# Patient Record
Sex: Female | Born: 1985 | Race: White | Hispanic: No | Marital: Single | Smoking: Never smoker
Health system: Southern US, Community
[De-identification: ages and names within clinical notes are randomized; demographics above are authoritative.]

## PROBLEM LIST (undated history)

## (undated) DIAGNOSIS — R519 Headache, unspecified: Secondary | ICD-10-CM

## (undated) DIAGNOSIS — K5190 Ulcerative colitis, unspecified, without complications: Secondary | ICD-10-CM

## (undated) DIAGNOSIS — F329 Major depressive disorder, single episode, unspecified: Secondary | ICD-10-CM

## (undated) DIAGNOSIS — O139 Gestational [pregnancy-induced] hypertension without significant proteinuria, unspecified trimester: Secondary | ICD-10-CM

## (undated) DIAGNOSIS — R51 Headache: Secondary | ICD-10-CM

## (undated) DIAGNOSIS — F32A Depression, unspecified: Secondary | ICD-10-CM

## (undated) DIAGNOSIS — J45909 Unspecified asthma, uncomplicated: Secondary | ICD-10-CM

## (undated) DIAGNOSIS — M5020 Other cervical disc displacement, unspecified cervical region: Secondary | ICD-10-CM

## (undated) HISTORY — PX: WISDOM TOOTH EXTRACTION: SHX21

## (undated) HISTORY — DX: Depression, unspecified: F32.A

## (undated) HISTORY — PX: COLONOSCOPY: SHX174

## (undated) HISTORY — DX: Other cervical disc displacement, unspecified cervical region: M50.20

## (undated) HISTORY — PX: KNEE SURGERY: SHX244

## (undated) HISTORY — DX: Headache, unspecified: R51.9

## (undated) HISTORY — DX: Major depressive disorder, single episode, unspecified: F32.9

## (undated) HISTORY — DX: Headache: R51

## (undated) HISTORY — PX: CHOLECYSTECTOMY: SHX55

## (undated) HISTORY — DX: Gestational (pregnancy-induced) hypertension without significant proteinuria, unspecified trimester: O13.9

---

## 2013-11-30 DIAGNOSIS — O139 Gestational [pregnancy-induced] hypertension without significant proteinuria, unspecified trimester: Secondary | ICD-10-CM

## 2013-11-30 HISTORY — DX: Gestational (pregnancy-induced) hypertension without significant proteinuria, unspecified trimester: O13.9

## 2014-10-04 DIAGNOSIS — O1490 Unspecified pre-eclampsia, unspecified trimester: Secondary | ICD-10-CM

## 2018-11-30 NOTE — L&D Delivery Note (Signed)
OB/GYN Faculty Practice Delivery Note  Vanessa Nolan is a 33 y.o. [id number] s/p SVD at [redacted]w[redacted]d. She was admitted for post-dates induction of labor.   ROM: 3h 66m with meconium-stained fluid fluid GBS Status: negative Maximum Maternal Temperature: Temp (24hrs), Avg:98.2 F (36.8 C), Min:97.6 F (36.4 C), Max:98.5 F (36.9 C)  Labor Progress: . Induction started with cytotec . Transitioned to pitocin . AROM meconium-stained fluid . Progressed to complete  Delivery Date/Time: 04/16/19 at 0058 Delivery: Called to room and patient was complete and pushing. Head delivered direct OP. Loose nuchal cord present reduced after delivery. Shoulder and body delivered in usual fashion. Infant with spontaneous cry, placed on mother's abdomen, dried and stimulated. Cord clamped x 2 after 1-minute delay, and cut by father of baby. Cord blood drawn. Placenta delivered spontaneously with gentle cord traction. Fundus firm with massage and Pitocin. Labia, perineum, vagina, and cervix inspected inspected with 1st degree perineal laceration and superficial labial lacerations. Of note, patient complaining of significant back pain throughout labor course, noted to be a chronic problem. Given IV fentanyl and oxycodone immediately after delivery for back pain. Discussed with patient that OP presentation likely exacerbated chronic back pain.   Placenta: spontaneous, intact, 3-vessel cord (sent to pathology given fetal diaphragmatic hernia) Complications: NICU attendance but infant vigorous, routine resuscitation Lacerations: 1st degree perineal and labial hemostatic EBL: 200cc Analgesia: IV fentanyl   Postpartum Planning [x]  message to sent to schedule follow-up  [x]  vaccines UTD  Infant: Vigorous female  APGARs 9, 9  weight pending but appears AGA   [name] S. Earlene Plater, DO OB/GYN Fellow, Faculty Practice

## 2018-12-01 ENCOUNTER — Emergency Department (HOSPITAL_BASED_OUTPATIENT_CLINIC_OR_DEPARTMENT_OTHER): Payer: Medicaid Other

## 2018-12-01 ENCOUNTER — Emergency Department (HOSPITAL_BASED_OUTPATIENT_CLINIC_OR_DEPARTMENT_OTHER)
Admission: EM | Admit: 2018-12-01 | Discharge: 2018-12-02 | Disposition: A | Discharge status: Home / Self Care | Payer: Medicaid Other | Attending: Emergency Medicine | Admitting: Emergency Medicine

## 2018-12-01 ENCOUNTER — Encounter (HOSPITAL_BASED_OUTPATIENT_CLINIC_OR_DEPARTMENT_OTHER): Payer: Self-pay

## 2018-12-01 ENCOUNTER — Other Ambulatory Visit: Payer: Self-pay

## 2018-12-01 DIAGNOSIS — O99512 Diseases of the respiratory system complicating pregnancy, second trimester: Secondary | ICD-10-CM | POA: Diagnosis present

## 2018-12-01 DIAGNOSIS — J111 Influenza due to unidentified influenza virus with other respiratory manifestations: Secondary | ICD-10-CM | POA: Diagnosis not present

## 2018-12-01 DIAGNOSIS — R6889 Other general symptoms and signs: Secondary | ICD-10-CM

## 2018-12-01 HISTORY — DX: Ulcerative colitis, unspecified, without complications: K51.90

## 2018-12-01 LAB — URINALYSIS, ROUTINE W REFLEX MICROSCOPIC
Bilirubin Urine: NEGATIVE
Glucose, UA: NEGATIVE mg/dL
Hgb urine dipstick: NEGATIVE
Ketones, ur: 15 mg/dL — AB
Leukocytes, UA: NEGATIVE
Nitrite: NEGATIVE
Protein, ur: NEGATIVE mg/dL
Specific Gravity, Urine: >1.03 — ABNORMAL HIGH (ref 1.005–1.030)
pH: 5.5 (ref 5.0–8.0)

## 2018-12-01 MED ORDER — ACETAMINOPHEN 325 MG PO TABS
650.0000 mg | ORAL_TABLET | Freq: Once | ORAL | Status: AC
  Administered 2018-12-02: 650 mg via ORAL
  Filled 2018-12-01: qty 2

## 2018-12-01 MED ORDER — LACTATED RINGERS IV BOLUS
1000.0000 mL | Freq: Once | INTRAVENOUS | Status: AC
  Administered 2018-12-01: 1000 mL via INTRAVENOUS

## 2018-12-01 NOTE — ED Notes (Addendum)
Pt c/o cough with chest and sinus congestion and fatigue x 2 months. Pt states she has been lying in bed x 2 days and felt like she needed to be evaluated. Pt also acknowledges pregnancy with no prental care r/t moving and not wanting to change physicians.

## 2018-12-01 NOTE — ED Notes (Signed)
Urine specimen to lab

## 2018-12-01 NOTE — ED Triage Notes (Signed)
Pt c/o flu like sx and fatigue x 2 months-pt states she is 4 months pregnant from LMP-no prenatal-pt NAD-steady gait

## 2018-12-02 LAB — CBC WITH DIFFERENTIAL/PLATELET
Abs Immature Granulocytes: 0.05 10*3/uL (ref 0.00–0.07)
Basophils Absolute: 0 10*3/uL (ref 0.0–0.1)
Basophils Relative: 0 %
Eosinophils Absolute: 0.1 10*3/uL (ref 0.0–0.5)
Eosinophils Relative: 1 %
HCT: 37.3 % (ref 36.0–46.0)
Hemoglobin: 12.3 g/dL (ref 12.0–15.0)
Immature Granulocytes: 1 %
Lymphocytes Relative: 17 %
Lymphs Abs: 0.8 10*3/uL (ref 0.7–4.0)
MCH: 29.4 pg (ref 26.0–34.0)
MCHC: 33 g/dL (ref 30.0–36.0)
MCV: 89 fL (ref 80.0–100.0)
Monocytes Absolute: 0.6 10*3/uL (ref 0.1–1.0)
Monocytes Relative: 13 %
Neutro Abs: 3.4 10*3/uL (ref 1.7–7.7)
Neutrophils Relative %: 68 %
Platelets: 218 10*3/uL (ref 150–400)
RBC: 4.19 MIL/uL (ref 3.87–5.11)
RDW: 13.5 % (ref 11.5–15.5)
WBC: 5 10*3/uL (ref 4.0–10.5)
nRBC: 0 % (ref 0.0–0.2)

## 2018-12-02 LAB — BASIC METABOLIC PANEL
Anion gap: 10 (ref 5–15)
BUN: 7 mg/dL (ref 6–20)
CO2: 19 mmol/L — ABNORMAL LOW (ref 22–32)
Calcium: 8.8 mg/dL — ABNORMAL LOW (ref 8.9–10.3)
Chloride: 106 mmol/L (ref 98–111)
Creatinine, Ser: 0.73 mg/dL (ref 0.44–1.00)
GFR calc Af Amer: >60 mL/min (ref >60)
GFR calc non Af Amer: >60 mL/min (ref >60)
Glucose, Bld: 104 mg/dL — ABNORMAL HIGH (ref 70–99)
Potassium: 3.5 mmol/L (ref 3.5–5.1)
Sodium: 135 mmol/L (ref 135–145)

## 2018-12-02 NOTE — ED Provider Notes (Signed)
[facility] EMERGENCY DEPARTMENT Provider Note   CSN: [id number] Arrival date & time: 12/01/18  2214     History   Chief Complaint Chief Complaint  Patient presents with  . Cough    HPI Vanessa Nolan is a 33 y.o. female.  HPI   33 year old female with multiple complaints.  She reports feeling "sick" for the past month.  Worsening symptoms in the past 2 days.  This is included body aches, subjective fevers, no energy, sore throat.  No urinary complaints.  She does not feel short of breath.  She does not take anything for her symptoms.  She is approximately 4 months pregnant without any prenatal care.  Past Medical History:  Diagnosis Date  . Ulcerative colitis ([place])     There are no active problems to display for this patient.   Past Surgical History:  Procedure Laterality Date  . CHOLECYSTECTOMY    . KNEE SURGERY       OB History    Gravida  7   Para  4   Term      Preterm      AB  1   Living        SAB  1   TAB      Ectopic      Multiple      Live Births               Home Medications    Prior to Admission medications   Medication Sig Start Date End Date Taking? Authorizing Provider  acetaminophen-codeine (TYLENOL #4) 300-60 MG tablet Take 1 tablet by mouth every 4 (four) hours as needed for pain.   Yes [provider]    Family History No family history on file.  Social History Social History   Tobacco Use  . Smoking status: Never Smoker  . Smokeless tobacco: Never Used  Substance Use Topics  . Alcohol use: Not Currently  . Drug use: Never     Allergies   Morphine and related   Review of Systems Review of Systems  All systems reviewed and negative, other than as noted in HPI.  Physical Exam Updated Vital Signs BP 126/71 (BP Location: Right Arm)   Pulse 76   Temp 99.4 F (37.4 C) (Oral)   Resp 18   Ht 5' 6.5" (1.689 m)   Wt 128.6 kg   LMP 07/16/2018 Comment: 4 months pregnant, lead shielding  provided//a.c.  SpO2 97%   BMI 45.09 kg/m   Physical Exam Vitals signs and nursing note reviewed.  Constitutional:      General: She is not in acute distress.    Appearance: She is well-developed. She is obese. She is not ill-appearing, toxic-appearing or diaphoretic.  HENT:     Head: Normocephalic and atraumatic.  Eyes:     General:        Right eye: No discharge.        Left eye: No discharge.     Conjunctiva/sclera: Conjunctivae normal.  Neck:     Musculoskeletal: Neck supple.  Cardiovascular:     Rate and Rhythm: Regular rhythm. Tachycardia present.     Heart sounds: Normal heart sounds. No murmur. No friction rub. No gallop.   Pulmonary:     Effort: Pulmonary effort is normal. No respiratory distress.     Breath sounds: Normal breath sounds.  Abdominal:     General: There is no distension.     Palpations: Abdomen is soft.  Tenderness: There is no abdominal tenderness.  Musculoskeletal:        General: No tenderness.  Skin:    General: Skin is warm and dry.  Neurological:     Mental Status: She is alert.  Psychiatric:        Behavior: Behavior normal.        Thought Content: Thought content normal.      ED Treatments / Results  Labs (all labs ordered are listed, but only abnormal results are displayed) Labs Reviewed  URINALYSIS, ROUTINE W REFLEX MICROSCOPIC - Abnormal; Notable for the following components:      Result Value   APPearance CLOUDY (*)    Specific Gravity, Urine >1.030 (*)    Ketones, ur 15 (*)    All other components within normal limits  BASIC METABOLIC PANEL - Abnormal; Notable for the following components:   CO2 19 (*)    Glucose, Bld 104 (*)    Calcium 8.8 (*)    All other components within normal limits  CBC WITH DIFFERENTIAL/PLATELET    EKG None  Radiology Dg Chest 2 View  Result Date: 12/02/2018 CLINICAL DATA:  Acute onset of cough, sinus congestion and fatigue. Fever. EXAM: CHEST - 2 VIEW COMPARISON:  None. FINDINGS: The  lungs are well-aerated. Minimal bibasilar atelectasis is noted. There is no evidence of pleural effusion or pneumothorax. The heart is normal in size; the mediastinal contour is within normal limits. No acute osseous abnormalities are seen. IMPRESSION: Minimal bibasilar atelectasis noted; lungs otherwise clear. Electronically Signed   By: Garald Balding M.D.   On: 12/02/2018 00:46    Procedures Procedures (including critical care time)  Medications Ordered in ED Medications  lactated ringers bolus 1,000 mL ( Intravenous Stopped 12/02/18 0105)  acetaminophen (TYLENOL) tablet 650 mg (650 mg Oral Given 12/02/18 0011)     Initial Impression / Assessment and Plan / ED Course  I have reviewed the triage vital signs and the nursing notes.  Pertinent labs & imaging results that were available during my care of the patient were reviewed by me and considered in my medical decision making (see chart for details).    33 year old female flulike symptoms.  Tachycardia resolved after some IV fluids and Tylenol.  ED work-up fairly unremarkable.  Chest x-ray clear.  Plan continue symptomatic treatment.  Encouraged to establish OB care. Final Clinical Impressions(s) / ED Diagnoses   Final diagnoses:  Flu-like symptoms    ED Discharge Orders    None       Virgel Manifold, MD 12/02/18 [id number]

## 2019-01-20 ENCOUNTER — Ambulatory Visit (INDEPENDENT_AMBULATORY_CARE_PROVIDER_SITE_OTHER): Payer: Medicaid Other | Admitting: *Deleted

## 2019-01-20 ENCOUNTER — Other Ambulatory Visit: Payer: Self-pay

## 2019-01-20 ENCOUNTER — Other Ambulatory Visit (HOSPITAL_COMMUNITY)
Admission: RE | Admit: 2019-01-20 | Discharge: 2019-01-20 | Disposition: A | Discharge status: Home / Self Care | Payer: Medicaid Other | Source: Ambulatory Visit | Attending: Family Medicine | Admitting: Family Medicine

## 2019-01-20 ENCOUNTER — Encounter: Payer: Self-pay | Admitting: *Deleted

## 2019-01-20 VITALS — BP 137/74 | HR 89 | Wt 286.2 lb

## 2019-01-20 DIAGNOSIS — K5190 Ulcerative colitis, unspecified, without complications: Secondary | ICD-10-CM | POA: Insufficient documentation

## 2019-01-20 DIAGNOSIS — O09299 Supervision of pregnancy with other poor reproductive or obstetric history, unspecified trimester: Secondary | ICD-10-CM | POA: Insufficient documentation

## 2019-01-20 DIAGNOSIS — M549 Dorsalgia, unspecified: Secondary | ICD-10-CM

## 2019-01-20 DIAGNOSIS — O0990 Supervision of high risk pregnancy, unspecified, unspecified trimester: Secondary | ICD-10-CM | POA: Insufficient documentation

## 2019-01-20 DIAGNOSIS — G8929 Other chronic pain: Secondary | ICD-10-CM | POA: Insufficient documentation

## 2019-01-20 DIAGNOSIS — Z8669 Personal history of other diseases of the nervous system and sense organs: Secondary | ICD-10-CM | POA: Insufficient documentation

## 2019-01-20 DIAGNOSIS — Z8659 Personal history of other mental and behavioral disorders: Secondary | ICD-10-CM

## 2019-01-20 NOTE — Progress Notes (Signed)
New Ob intake completed and pregnancy information packet given. Labs drawn, Medicaid Home form completed and [place] for anatomy scheduled. Initial Ob visit w/provider scheduled on 3/9.

## 2019-01-21 LAB — PROTEIN / CREATININE RATIO, URINE
Creatinine, Urine: 190.6 mg/dL
PROTEIN/CREAT RATIO: 150 mg/g{creat} (ref 0–200)
Protein, Ur: 28.5 mg/dL

## 2019-01-22 LAB — CULTURE, OB URINE

## 2019-01-22 LAB — URINE CULTURE, OB REFLEX: Organism ID, Bacteria: NO GROWTH

## 2019-01-23 LAB — GC/CHLAMYDIA PROBE AMP (~~LOC~~) NOT AT ARMC
Chlamydia: NEGATIVE
NEISSERIA GONORRHEA: NEGATIVE

## 2019-01-25 ENCOUNTER — Ambulatory Visit (HOSPITAL_COMMUNITY): Payer: Medicaid Other | Admitting: *Deleted

## 2019-01-25 ENCOUNTER — Ambulatory Visit (HOSPITAL_COMMUNITY)
Admission: RE | Admit: 2019-01-25 | Discharge: 2019-01-25 | Disposition: A | Discharge status: Home / Self Care | Payer: Medicaid Other | Source: Ambulatory Visit | Attending: Obstetrics and Gynecology | Admitting: Obstetrics and Gynecology

## 2019-01-25 DIAGNOSIS — Z3A29 29 weeks gestation of pregnancy: Secondary | ICD-10-CM

## 2019-01-25 DIAGNOSIS — O09299 Supervision of pregnancy with other poor reproductive or obstetric history, unspecified trimester: Secondary | ICD-10-CM

## 2019-01-25 DIAGNOSIS — O0990 Supervision of high risk pregnancy, unspecified, unspecified trimester: Secondary | ICD-10-CM | POA: Diagnosis present

## 2019-01-25 DIAGNOSIS — Z363 Encounter for antenatal screening for malformations: Secondary | ICD-10-CM | POA: Diagnosis not present

## 2019-01-25 DIAGNOSIS — O09213 Supervision of pregnancy with history of pre-term labor, third trimester: Secondary | ICD-10-CM | POA: Diagnosis not present

## 2019-01-25 DIAGNOSIS — O2693 Pregnancy related conditions, unspecified, third trimester: Secondary | ICD-10-CM

## 2019-01-25 DIAGNOSIS — O283 Abnormal ultrasonic finding on antenatal screening of mother: Secondary | ICD-10-CM | POA: Insufficient documentation

## 2019-01-25 DIAGNOSIS — O09293 Supervision of pregnancy with other poor reproductive or obstetric history, third trimester: Secondary | ICD-10-CM | POA: Diagnosis not present

## 2019-01-26 ENCOUNTER — Other Ambulatory Visit (HOSPITAL_COMMUNITY): Payer: Self-pay | Admitting: *Deleted

## 2019-01-26 DIAGNOSIS — Q332 Sequestration of lung: Secondary | ICD-10-CM

## 2019-01-31 ENCOUNTER — Telehealth: Payer: Self-pay | Admitting: Family Medicine

## 2019-01-31 LAB — OBSTETRIC PANEL, INCLUDING HIV
Antibody Screen: NEGATIVE
Basophils Absolute: 0 10*3/uL (ref 0.0–0.2)
Basos: 0 %
EOS (ABSOLUTE): 0.1 10*3/uL (ref 0.0–0.4)
Eos: 1 %
HIV Screen 4th Generation wRfx: NONREACTIVE
Hematocrit: 36.1 % (ref 34.0–46.6)
Hemoglobin: 11.4 g/dL (ref 11.1–15.9)
Hepatitis B Surface Ag: NEGATIVE
Immature Grans (Abs): 0.1 10*3/uL (ref 0.0–0.1)
Immature Granulocytes: 1 %
Lymphocytes Absolute: 1.4 10*3/uL (ref 0.7–3.1)
Lymphs: 13 %
MCH: 27.5 pg (ref 26.6–33.0)
MCHC: 31.6 g/dL (ref 31.5–35.7)
MCV: 87 fL (ref 79–97)
Monocytes Absolute: 0.6 10*3/uL (ref 0.1–0.9)
Monocytes: 5 %
Neutrophils Absolute: 8.3 10*3/uL — ABNORMAL HIGH (ref 1.4–7.0)
Neutrophils: 80 %
PLATELETS: 285 10*3/uL (ref 150–450)
RBC: 4.14 x10E6/uL (ref 3.77–5.28)
RDW: 13.7 % (ref 11.7–15.4)
RPR Ser Ql: NONREACTIVE
RUBELLA: 1.47 {index} (ref >0.99)
Rh Factor: POSITIVE
WBC: 10.6 10*3/uL (ref 3.4–10.8)

## 2019-01-31 LAB — INHERITEST(R) CF/SMA PANEL

## 2019-01-31 LAB — HEMOGLOBINOPATHY EVALUATION
Ferritin: 11 ng/mL — ABNORMAL LOW (ref 15–150)
Hgb A2 Quant: 2.5 % (ref 1.8–3.2)
Hgb A: 97.5 % (ref 96.4–98.8)
Hgb C: 0 %
Hgb F Quant: 0 % (ref 0.0–2.0)
Hgb S: 0 %
Hgb Solubility: NEGATIVE
Hgb Variant: 0 %

## 2019-01-31 LAB — COMPREHENSIVE METABOLIC PANEL
ALT: 11 IU/L (ref 0–32)
AST: 10 IU/L (ref 0–40)
Albumin/Globulin Ratio: 1.2 (ref 1.2–2.2)
Albumin: 3.5 g/dL — ABNORMAL LOW (ref 3.8–4.8)
Alkaline Phosphatase: 86 IU/L (ref 39–117)
BUN/Creatinine Ratio: 14 (ref 9–23)
BUN: 9 mg/dL (ref 6–20)
Bilirubin Total: <0.2 mg/dL (ref 0.0–1.2)
CO2: 19 mmol/L — ABNORMAL LOW (ref 20–29)
Calcium: 9 mg/dL (ref 8.7–10.2)
Chloride: 103 mmol/L (ref 96–106)
Creatinine, Ser: 0.65 mg/dL (ref 0.57–1.00)
GFR calc Af Amer: 137 mL/min/{1.73_m2} (ref >59)
GFR calc non Af Amer: 119 mL/min/{1.73_m2} (ref >59)
Globulin, Total: 3 g/dL (ref 1.5–4.5)
Glucose: 113 mg/dL — ABNORMAL HIGH (ref 65–99)
Potassium: 3.8 mmol/L (ref 3.5–5.2)
Sodium: 137 mmol/L (ref 134–144)
TOTAL PROTEIN: 6.5 g/dL (ref 6.0–8.5)

## 2019-01-31 NOTE — Telephone Encounter (Signed)
Called the patient in attempt to reschedule the appointment. Informed the patient of two different times and days however the patient did not want an appointment at an later time as she stated she is 30 weeks. Stated she has not had any prenatal care. Also stated she did not have a babysitter before 2 pm. Continued with the original appointment time.

## 2019-02-02 ENCOUNTER — Other Ambulatory Visit: Payer: Self-pay | Admitting: *Deleted

## 2019-02-02 ENCOUNTER — Encounter: Payer: Medicaid Other | Admitting: Obstetrics and Gynecology

## 2019-02-02 DIAGNOSIS — O0990 Supervision of high risk pregnancy, unspecified, unspecified trimester: Secondary | ICD-10-CM

## 2019-02-03 ENCOUNTER — Other Ambulatory Visit: Payer: Self-pay | Admitting: Obstetrics and Gynecology

## 2019-02-03 DIAGNOSIS — O0990 Supervision of high risk pregnancy, unspecified, unspecified trimester: Secondary | ICD-10-CM

## 2019-02-06 ENCOUNTER — Other Ambulatory Visit (HOSPITAL_COMMUNITY)
Admission: RE | Admit: 2019-02-06 | Discharge: 2019-02-06 | Disposition: A | Discharge status: Home / Self Care | Payer: Medicaid Other | Source: Ambulatory Visit | Attending: Obstetrics and Gynecology | Admitting: Obstetrics and Gynecology

## 2019-02-06 ENCOUNTER — Encounter: Payer: Self-pay | Admitting: Gastroenterology

## 2019-02-06 ENCOUNTER — Ambulatory Visit (INDEPENDENT_AMBULATORY_CARE_PROVIDER_SITE_OTHER): Payer: Medicaid Other | Admitting: Obstetrics and Gynecology

## 2019-02-06 ENCOUNTER — Other Ambulatory Visit: Payer: Self-pay

## 2019-02-06 ENCOUNTER — Encounter: Payer: Self-pay | Admitting: Obstetrics and Gynecology

## 2019-02-06 ENCOUNTER — Encounter: Payer: Medicaid Other | Admitting: Family Medicine

## 2019-02-06 ENCOUNTER — Ambulatory Visit: Payer: Medicaid Other | Admitting: Clinical

## 2019-02-06 ENCOUNTER — Other Ambulatory Visit: Payer: Medicaid Other

## 2019-02-06 VITALS — BP 116/81 | HR 92 | Wt 284.5 lb

## 2019-02-06 DIAGNOSIS — O0990 Supervision of high risk pregnancy, unspecified, unspecified trimester: Secondary | ICD-10-CM

## 2019-02-06 DIAGNOSIS — O26892 Other specified pregnancy related conditions, second trimester: Secondary | ICD-10-CM | POA: Diagnosis not present

## 2019-02-06 DIAGNOSIS — Z8659 Personal history of other mental and behavioral disorders: Secondary | ICD-10-CM

## 2019-02-06 DIAGNOSIS — G8929 Other chronic pain: Secondary | ICD-10-CM | POA: Diagnosis not present

## 2019-02-06 DIAGNOSIS — Z3A21 21 weeks gestation of pregnancy: Secondary | ICD-10-CM

## 2019-02-06 DIAGNOSIS — M549 Dorsalgia, unspecified: Secondary | ICD-10-CM | POA: Diagnosis not present

## 2019-02-06 DIAGNOSIS — K5190 Ulcerative colitis, unspecified, without complications: Secondary | ICD-10-CM

## 2019-02-06 DIAGNOSIS — Z79891 Long term (current) use of opiate analgesic: Secondary | ICD-10-CM | POA: Insufficient documentation

## 2019-02-06 DIAGNOSIS — Z8751 Personal history of pre-term labor: Secondary | ICD-10-CM | POA: Insufficient documentation

## 2019-02-06 DIAGNOSIS — O0932 Supervision of pregnancy with insufficient antenatal care, second trimester: Secondary | ICD-10-CM

## 2019-02-06 DIAGNOSIS — Z6841 Body Mass Index (BMI) 40.0 and over, adult: Secondary | ICD-10-CM

## 2019-02-06 DIAGNOSIS — Z23 Encounter for immunization: Secondary | ICD-10-CM

## 2019-02-06 DIAGNOSIS — O09299 Supervision of pregnancy with other poor reproductive or obstetric history, unspecified trimester: Secondary | ICD-10-CM

## 2019-02-06 DIAGNOSIS — O0930 Supervision of pregnancy with insufficient antenatal care, unspecified trimester: Secondary | ICD-10-CM | POA: Insufficient documentation

## 2019-02-06 DIAGNOSIS — O99212 Obesity complicating pregnancy, second trimester: Secondary | ICD-10-CM

## 2019-02-06 DIAGNOSIS — O99210 Obesity complicating pregnancy, unspecified trimester: Secondary | ICD-10-CM | POA: Insufficient documentation

## 2019-02-06 DIAGNOSIS — O09292 Supervision of pregnancy with other poor reproductive or obstetric history, second trimester: Secondary | ICD-10-CM

## 2019-02-06 MED ORDER — MESALAMINE 1.2 G PO TBEC: 1.2000 g | DELAYED_RELEASE_TABLET | Freq: Two times a day (BID) | ORAL | 0 refills | Status: DC

## 2019-02-06 MED ORDER — PREPLUS 27-1 MG PO TABS: 1.0000 | ORAL_TABLET | Freq: Every day | ORAL | 3 refills | Status: DC

## 2019-02-06 NOTE — Progress Notes (Signed)
New OB Note  02/06/2019   Clinic: Center for [facility]  Chief Complaint: NOB  Transfer of Care Patient: no  History of Present Illness: Vanessa Nolan is a 33 y.o. [id number] @ 21/2 weeks (EDC 5/9, based on 29wk u/s)  Patient's last menstrual period was 07/16/2018 (exact date). Preg complicated by has Supervision of high risk pregnancy, antepartum; Ulcerative colitis ([place]); History of pre-eclampsia in prior pregnancy, currently pregnant; Chronic back pain; History of depression; Hx of migraine headaches; 2cm subdiaphragmatic mass; Obesity in pregnancy; BMI 45.0-49.9, adult ([place]); Chronic prescription opiate use; and Late prenatal care on their problem list.   Any events prior to today's visit: no She has Negative signs or symptoms of miscarriage or preterm labor  ROS: A 12-point review of systems was performed and negative, except as stated in the above HPI.  OBGYN History: As per HPI. OB History  Gravida Para Term Preterm AB Living  6 4 3 1 1 4   SAB TAB Ectopic Multiple Live Births  1       4    # Outcome Date GA Lbr Len/2nd Weight Sex Delivery Anes PTL Lv  6 Current           5 Preterm 10/04/14 [redacted]w[redacted]d 5 lb 1 oz (2.296 kg) F Vag-Spont None N LIV     Complications: Pre-eclampsia  4 Term 03/28/13 336w0d7 lb 2 oz (3.232 kg) M Vag-Spont None N LIV  3 Term 04/22/12 397w6d lb 8 oz (3.402 kg) M Vag-Spont EPI N LIV  2 SAB 02/27/11 9w030w0dSAB     1 Term 08/11/08 39w456w4db 14 oz (3.118 kg) F Vag-Spont EPI N LIV     Past Medical History: Past Medical History:  Diagnosis Date  . Depression   . Headache    Migraines  . Herniated disc, cervical   . Pregnancy induced hypertension 2015  . Preterm labor   . Ulcerative colitis (HCC) [place] Ulcerative colitis (HCC)[facility] Past Surgical History: Past Surgical History:  Procedure Laterality Date  . CHOLECYSTECTOMY    . COLONOSCOPY    . KNEE SURGERY    . WISDOM TOOTH EXTRACTION      Family History:  Family History  Problem  Relation Age of Onset  . Diabetes Father   . Cancer Brother        testicular    Social History:  Social History   Socioeconomic History  . Marital status: Single    Spouse name: Not on file  . Number of children: Not on file  . Years of education: Not on file  . Highest education level: Not on file  Occupational History  . Not on file  Social Needs  . Financial resource strain: Not on file  . Food insecurity:    Worry: Sometimes true    Inability: Sometimes true  . Transportation needs:    Medical: No    Non-medical: No  Tobacco Use  . Smoking status: Never Smoker  . Smokeless tobacco: Never Used  Substance and Sexual Activity  . Alcohol use: Not Currently  . Drug use: Never  . Sexual activity: Yes    Birth control/protection: None  Lifestyle  . Physical activity:    Days per week: Not on file    Minutes per session: Not on file  . Stress: Not on file  Relationships  . Social connections:    Talks on phone: Not on file  Gets together: Not on file    Attends religious service: Not on file    Active member of club or organization: Not on file    Attends meetings of clubs or organizations: Not on file    Relationship status: Not on file  . Intimate partner violence:    Fear of current or ex partner: No    Emotionally abused: No    Physically abused: No    Forced sexual activity: No  Other Topics Concern  . Not on file  Social History Narrative  . Not on file    Allergy: Allergies  Allergen Reactions  . Morphine And Related Other (See Comments)    tachycardia    Health Maintenance:  Mammogram Up to Date: not applicable  Current Outpatient Medications: Mesalamine 1.2gm bid, PPI, Tylenol #4 tid, ultram 50 q6h Physical Exam:   BP 116/81   Pulse 92   Wt 284 lb 8 oz (129 kg)   LMP 07/16/2018 (Exact Date) Comment: 4 months pregnant, lead shielding provided//a.c.  BMI 45.23 kg/m  Body mass index is 45.23 kg/m. Contractions: Not present Vag.  Bleeding: None. FHTs: 140s  General appearance: Well nourished, well developed female in no acute distress.  Cardiovascular: S1, S2 normal, no murmur, rub or gallop, regular rate and rhythm Respiratory:  Clear to auscultation bilateral. Normal respiratory effort Abdomen: obese positive bowel sounds and no masses, hernias; diffusely non tender to palpation, non distended Breasts: patient declines any blood s/s. Neuro/Psych:  Normal mood and affect.  Skin:  Warm and dry.  Lymphatic:  No inguinal lymphadenopathy.   Pelvic exam: is limited by body habitus EGBUS: within normal limits, Vagina: within normal limits and with no blood in the vault, Cervix: normal appearing cervix without discharge or lesions, visually closed and thick Uterus:  enlarged, c/w 30 week size, and Adnexa:  normal adnexa and no mass, fullness, tenderness  Laboratory: reviewed  Imaging:  reviewed  Assessment: pt stable  Plan: 1. Supervision of high risk pregnancy, antepartum Patient moved from [place], [place] and was on the way the [facility] but stayed here and was waiting to get medicaid. She states she hasn't seen anyone for this pregnancy or had any u/s outside of with [place]. Pt late. Will do 1h GTT. Pap done. BTL papers to be signed today. Only wants if has c/s - Glucose tolerance, 1 hour - Cytology - PAP  2. BMI 45.0-49.9, adult ([place])  3. Ulcerative colitis without complications, unspecified location [facility]) Last saw a GI provider a year ago ([facility], Dr. Deatra Ina, [place], [place]). Needs refill on mesalamine. Refill sent in. Referral for GI placed. Will have her sign and get records - Ambulatory referral to Gastroenterology  4. Chronic prescription opiate use Due to back issues.  Pt states she is on the above regimen and is getting medications with her doctors in [place] and last saw them in January. D/w her about potentially switching to subuxone. Pt seems interested. Will have her set up to see someone here  about maybe doing this. Will need to d/w her re: NAS at some point  5. No prenatal care  6. H/o PTB ? Due to pre-eclampsia.   7. Abnormal fetal u/s MFM following  Problem list reviewed and updated.  Follow up in 1 week   >50% of 30 min visit spent on counseling and coordination of care.     Durene Romans MD Attending Center for [facility])

## 2019-02-06 NOTE — BH Specialist Note (Signed)
Integrated Behavioral Health Initial Visit  MRN: [id number] Name: Vanessa Nolan  Number of [place] Clinician visits:: 1/6 Session Start time: 10:22  Session End time: 10:37 Total time: 15 minutes  Type of Service: [place] Interpretor:No. Interpretor Name and Language: n/a   Warm Hand Off Completed.       SUBJECTIVE: Vanessa Nolan is a 33 y.o. female accompanied by n/a Patient was referred by Aletha Halim, MD for positive depression screen Patient reports the following symptoms/concerns: Pt attributes positive depression screen to chronic back pain; no other concern today. Duration of problem: Ongoing; Severity of problem: moderate  OBJECTIVE: Mood: Normal and Affect: Appropriate Risk of harm to self or others: No plan to harm self or others  LIFE CONTEXT: Family and Social: Pt lives with FOB  and children (10yo;6yo;5yo;4yo) School/Work: - Self-Care: - Life Changes: Current pregnancy; move from [place] to [place] in October 2019  GOALS ADDRESSED: Patient will: 1. Reduce symptoms of: depression 2. Increase knowledge and/or ability of: healthy habits  3. Demonstrate ability to: Increase healthy adjustment to current life circumstances and Increase adequate support systems for patient/family  INTERVENTIONS: Interventions utilized: Psychoeducation and/or Health Education  Standardized Assessments completed: GAD-7 and PHQ 9  ASSESSMENT: Patient currently experiencing Supervision of high risk pregnancy, antepartum, and History of depression   Patient may benefit from Initial OB introduction to integrated behavioral health services and psychoeducation regarding coping with symptoms of depression.  PLAN: 1. Follow up with behavioral health clinician on : As needed 2. Behavioral recommendations:  -Continue taking prenatal vitamin daily, as recommended by medical provider -Read educational materials regarding coping with  symptoms of depression  3. Referral(s): [place] (In Clinic) 4. "From scale of 1-10, how likely are you to follow plan?": -  Garlan Fair, LCSW  Depression screen [facility] 2/9 01/20/2019  Decreased Interest 2  Down, Depressed, Hopeless 1  PHQ - 2 Score 3  Altered sleeping 3  Tired, decreased energy 2  Change in appetite 2  Feeling bad or failure about yourself  0  Trouble concentrating 1  Moving slowly or fidgety/restless 1  Suicidal thoughts 0  PHQ-9 Score 12   GAD 7 : Generalized Anxiety Score 01/20/2019  Nervous, Anxious, on Edge 1  Control/stop worrying 0  Worry too much - different things 1  Trouble relaxing 0  Restless 1  Easily annoyed or irritable 1  Afraid - awful might happen 0  Total GAD 7 Score 4

## 2019-02-07 LAB — GLUCOSE TOLERANCE, 1 HOUR: Glucose, 1Hr PP: 150 mg/dL (ref 65–199)

## 2019-02-08 ENCOUNTER — Encounter: Payer: Medicaid Other | Admitting: Obstetrics and Gynecology

## 2019-02-08 ENCOUNTER — Other Ambulatory Visit: Payer: Self-pay

## 2019-02-08 ENCOUNTER — Ambulatory Visit (HOSPITAL_COMMUNITY)
Admission: RE | Admit: 2019-02-08 | Discharge: 2019-02-08 | Disposition: A | Discharge status: Home / Self Care | Payer: Medicaid Other | Source: Ambulatory Visit | Attending: Obstetrics and Gynecology | Admitting: Obstetrics and Gynecology

## 2019-02-08 ENCOUNTER — Encounter: Payer: Self-pay | Admitting: *Deleted

## 2019-02-08 ENCOUNTER — Encounter (HOSPITAL_COMMUNITY): Payer: Self-pay | Admitting: *Deleted

## 2019-02-08 ENCOUNTER — Ambulatory Visit (HOSPITAL_COMMUNITY): Payer: Medicaid Other | Admitting: *Deleted

## 2019-02-08 DIAGNOSIS — O359XX0 Maternal care for (suspected) fetal abnormality and damage, unspecified, not applicable or unspecified: Secondary | ICD-10-CM

## 2019-02-08 DIAGNOSIS — O0990 Supervision of high risk pregnancy, unspecified, unspecified trimester: Secondary | ICD-10-CM | POA: Insufficient documentation

## 2019-02-08 DIAGNOSIS — Q332 Sequestration of lung: Secondary | ICD-10-CM | POA: Insufficient documentation

## 2019-02-08 DIAGNOSIS — O09213 Supervision of pregnancy with history of pre-term labor, third trimester: Secondary | ICD-10-CM

## 2019-02-08 DIAGNOSIS — O2693 Pregnancy related conditions, unspecified, third trimester: Secondary | ICD-10-CM | POA: Diagnosis not present

## 2019-02-08 DIAGNOSIS — O09293 Supervision of pregnancy with other poor reproductive or obstetric history, third trimester: Secondary | ICD-10-CM | POA: Diagnosis not present

## 2019-02-08 DIAGNOSIS — O09299 Supervision of pregnancy with other poor reproductive or obstetric history, unspecified trimester: Secondary | ICD-10-CM | POA: Diagnosis present

## 2019-02-08 DIAGNOSIS — Z3A31 31 weeks gestation of pregnancy: Secondary | ICD-10-CM

## 2019-02-08 DIAGNOSIS — Z363 Encounter for antenatal screening for malformations: Secondary | ICD-10-CM

## 2019-02-08 LAB — CYTOLOGY - PAP
Chlamydia: NEGATIVE
Diagnosis: NEGATIVE
HPV: NOT DETECTED
Neisseria Gonorrhea: NEGATIVE
Trichomonas: NEGATIVE

## 2019-02-09 ENCOUNTER — Other Ambulatory Visit (HOSPITAL_COMMUNITY): Payer: Self-pay | Admitting: *Deleted

## 2019-02-09 DIAGNOSIS — O359XX0 Maternal care for (suspected) fetal abnormality and damage, unspecified, not applicable or unspecified: Secondary | ICD-10-CM

## 2019-02-09 NOTE — Progress Notes (Signed)
02/09/19 Left message for Janan Ridge, RN in NICU for Neonatology consult.

## 2019-02-13 ENCOUNTER — Telehealth: Payer: Self-pay | Admitting: *Deleted

## 2019-02-13 ENCOUNTER — Encounter: Payer: Self-pay | Admitting: Obstetrics and Gynecology

## 2019-02-13 DIAGNOSIS — O99810 Abnormal glucose complicating pregnancy: Secondary | ICD-10-CM | POA: Insufficient documentation

## 2019-02-13 NOTE — Telephone Encounter (Signed)
-----   Message from [place] Bing, MD sent at 02/13/2019 10:25 AM EDT ----- Needs 3h GTT

## 2019-02-13 NOTE — Telephone Encounter (Signed)
I called Vanessa Nolan and notified her that she failed the one hour gtt and that Dr.Pickens has recommended she do a 3 hr GTT. She states she can not do a 3  Hr gtt, that she barely got the glucola down and kept it down for the 1 hr gtt and that she was told the 3 hr is even sweeter. I informed her we can do a jelly bean test and placed her on hold to dicuss with our lab tech. She informed me per policy pt. Has to attempt a 3 hr test and fail being able to keep glucola down before we can do jellybean test.  I notified Vanessa Nolan of this and she states she can not do the 3 hour test . I informed her I would notify Dr. Ilda Basset and if he has new orders we will call her back ; she voices understanding.

## 2019-02-15 MED ORDER — GLUCOSE BLOOD VI STRP: ORAL_STRIP | 12 refills | Status: DC

## 2019-02-15 MED ORDER — ACCU-CHEK GUIDE W/DEVICE KIT: 1.0000 | PACK | Freq: Four times a day (QID) | 0 refills | Status: DC

## 2019-02-15 MED ORDER — ACCU-CHEK FASTCLIX LANCETS MISC: 1.0000 | Freq: Four times a day (QID) | 12 refills | Status: DC

## 2019-02-15 NOTE — Telephone Encounter (Signed)
Let her know the only other alternative is to have her check her blood sugars 4x/day (fasting and 2 hour post prandial) for a week.

## 2019-02-15 NOTE — Telephone Encounter (Signed)
Notified pt Dr. Vergie Living recommendation.  Pt stated that she will check her BS.  Ordered Medicaid approved meter, lancets, and test strips and sent Rx to [facility] pharmacy on [address].  Pt notified to check BS qid- fasting and then two hours after each meal.  I advised pt to write down her values and bring them to her appt on Monday.  Pt stated understanding.

## 2019-02-16 ENCOUNTER — Ambulatory Visit: Payer: Medicaid Other | Admitting: Gastroenterology

## 2019-02-20 ENCOUNTER — Ambulatory Visit (INDEPENDENT_AMBULATORY_CARE_PROVIDER_SITE_OTHER): Payer: Medicaid Other | Admitting: Family Medicine

## 2019-02-20 ENCOUNTER — Encounter: Payer: Self-pay | Admitting: *Deleted

## 2019-02-20 ENCOUNTER — Other Ambulatory Visit: Payer: Self-pay

## 2019-02-20 VITALS — BP 113/72 | HR 115 | Wt 285.0 lb

## 2019-02-20 DIAGNOSIS — Z3A33 33 weeks gestation of pregnancy: Secondary | ICD-10-CM

## 2019-02-20 DIAGNOSIS — O99810 Abnormal glucose complicating pregnancy: Secondary | ICD-10-CM | POA: Diagnosis not present

## 2019-02-20 DIAGNOSIS — O99323 Drug use complicating pregnancy, third trimester: Secondary | ICD-10-CM

## 2019-02-20 DIAGNOSIS — F1190 Opioid use, unspecified, uncomplicated: Secondary | ICD-10-CM

## 2019-02-20 DIAGNOSIS — Z6841 Body Mass Index (BMI) 40.0 and over, adult: Secondary | ICD-10-CM

## 2019-02-20 DIAGNOSIS — O26893 Other specified pregnancy related conditions, third trimester: Secondary | ICD-10-CM

## 2019-02-20 DIAGNOSIS — Z79891 Long term (current) use of opiate analgesic: Secondary | ICD-10-CM

## 2019-02-20 DIAGNOSIS — K5190 Ulcerative colitis, unspecified, without complications: Secondary | ICD-10-CM | POA: Diagnosis not present

## 2019-02-20 DIAGNOSIS — O99213 Obesity complicating pregnancy, third trimester: Secondary | ICD-10-CM

## 2019-02-20 DIAGNOSIS — O283 Abnormal ultrasonic finding on antenatal screening of mother: Secondary | ICD-10-CM

## 2019-02-20 DIAGNOSIS — O0990 Supervision of high risk pregnancy, unspecified, unspecified trimester: Secondary | ICD-10-CM

## 2019-02-20 NOTE — Progress Notes (Signed)
Subjective:  Vanessa Nolan is a 33 y.o. [id number] at 35w2dbeing seen today for ongoing prenatal care.  She is currently monitored for the following issues for this high-risk pregnancy and has Supervision of high risk pregnancy, antepartum; Ulcerative colitis ([place]; History of pre-eclampsia in prior pregnancy, currently pregnant; Chronic back pain; History of depression; Hx of migraine headaches; Fetal 2cm subdiaphragmatic mass on ultrasound; Obesity in pregnancy; BMI 45.0-49.9, adult ([place]; Chronic prescription opiate use; Late prenatal care; History of preterm delivery; and Abnormal glucose affecting pregnancy on their problem list.  GDM: Patient diet controlled. Hasn't started testing yet - hasn't picked up testing supplies.   Patient reports no complaints.  Contractions: Irritability. Vag. Bleeding: None.  Movement: Present. Denies leaking of fluid.   The following portions of the patient's history were reviewed and updated as appropriate: allergies, current medications, past family history, past medical history, past social history, past surgical history and problem list. Problem list updated.  Objective:   Vitals:   02/20/19 1349  BP: 113/72  Pulse: (!) 115  Weight: 285 lb (129.3 kg)    Fetal Status: Fetal Heart Rate (bpm): 145   Movement: Present     General:  Alert, oriented and cooperative. Patient is in no acute distress.  Skin: Skin is warm and dry. No rash noted.   Cardiovascular: Normal heart rate noted  Respiratory: Normal respiratory effort, no problems with respiration noted  Abdomen: Soft, gravid, appropriate for gestational age. Pain/Pressure: Present     Pelvic: Vag. Bleeding: None     Cervical exam deferred        Extremities: Normal range of motion.  Edema: None  Mental Status: Normal mood and affect. Normal behavior. Normal judgment and thought content.   Urinalysis:      Assessment and Plan:  Pregnancy: [id number] 33w2d1. Supervision of high risk pregnancy,  antepartum FHT and FH normal  2. Chronic prescription opiate use Continue chronic pain.   3. BMI 45.0-49.9, adult ([place] 4. Ulcerative colitis without complications, unspecified location ([facility]Has appt with GI doc 4/15.  5. Fetal 2cm subdiaphragmatic mass on ultrasound Repeat [place] 2 weeks  6. Abnormal glucose affecting pregnancy Patient to pick up testing supplies.   Preterm labor symptoms and general obstetric precautions including but not limited to vaginal bleeding, contractions, leaking of fluid and fetal movement were reviewed in detail with the patient. Please refer to After Visit Summary for other counseling recommendations.  No follow-ups on file.   StTruett MainlandDO

## 2019-02-20 NOTE — Progress Notes (Signed)
Spoke with Vanessa Nolan reg NICU consult, gave pts name & mr#. Stated not sure due to COVID-19 when pt can be seen.

## 2019-02-21 ENCOUNTER — Encounter: Payer: Self-pay | Admitting: *Deleted

## 2019-02-22 ENCOUNTER — Other Ambulatory Visit: Payer: Self-pay

## 2019-02-22 ENCOUNTER — Ambulatory Visit (HOSPITAL_COMMUNITY): Payer: Medicaid Other | Admitting: *Deleted

## 2019-02-22 ENCOUNTER — Ambulatory Visit (HOSPITAL_COMMUNITY)
Admission: RE | Admit: 2019-02-22 | Discharge: 2019-02-22 | Disposition: A | Discharge status: Home / Self Care | Payer: Medicaid Other | Source: Ambulatory Visit | Attending: Obstetrics and Gynecology | Admitting: Obstetrics and Gynecology

## 2019-02-22 ENCOUNTER — Other Ambulatory Visit (HOSPITAL_COMMUNITY): Payer: Self-pay | Admitting: *Deleted

## 2019-02-22 ENCOUNTER — Encounter (HOSPITAL_COMMUNITY): Payer: Self-pay

## 2019-02-22 VITALS — BP 134/82 | HR 88 | Temp 98.4°F

## 2019-02-22 DIAGNOSIS — O0990 Supervision of high risk pregnancy, unspecified, unspecified trimester: Secondary | ICD-10-CM | POA: Diagnosis present

## 2019-02-22 DIAGNOSIS — O09213 Supervision of pregnancy with history of pre-term labor, third trimester: Secondary | ICD-10-CM | POA: Diagnosis not present

## 2019-02-22 DIAGNOSIS — Z3A33 33 weeks gestation of pregnancy: Secondary | ICD-10-CM

## 2019-02-22 DIAGNOSIS — O09299 Supervision of pregnancy with other poor reproductive or obstetric history, unspecified trimester: Secondary | ICD-10-CM | POA: Insufficient documentation

## 2019-02-22 DIAGNOSIS — O359XX0 Maternal care for (suspected) fetal abnormality and damage, unspecified, not applicable or unspecified: Secondary | ICD-10-CM

## 2019-02-22 DIAGNOSIS — O2693 Pregnancy related conditions, unspecified, third trimester: Secondary | ICD-10-CM | POA: Diagnosis not present

## 2019-02-22 DIAGNOSIS — Z8759 Personal history of other complications of pregnancy, childbirth and the puerperium: Secondary | ICD-10-CM | POA: Insufficient documentation

## 2019-02-22 DIAGNOSIS — O09293 Supervision of pregnancy with other poor reproductive or obstetric history, third trimester: Secondary | ICD-10-CM | POA: Diagnosis not present

## 2019-02-22 DIAGNOSIS — Z362 Encounter for other antenatal screening follow-up: Secondary | ICD-10-CM

## 2019-02-22 DIAGNOSIS — Q332 Sequestration of lung: Secondary | ICD-10-CM

## 2019-03-02 ENCOUNTER — Telehealth: Payer: Self-pay

## 2019-03-02 NOTE — Telephone Encounter (Signed)
Left message for patient to switch appt to Zoom.

## 2019-03-03 NOTE — Telephone Encounter (Signed)
Patient states she is not sure if she can download the app Zoom on her phone. Explained to patient how to download the app. Patient she will try this and let me know if she cannot. Also asked patient if she has any previous GI records at home that she can drop by our office. Patient states she does not. Informed patient that it will make it harder to care for her without those records and we still have to have her sign a ROI to get them. Patient verbalized understanding.  Informed patient that we realize she is pregnant and we are trying to limit her exposure to the covid19. Patient states she agrees.

## 2019-03-09 ENCOUNTER — Telehealth: Payer: Self-pay | Admitting: Family Medicine

## 2019-03-09 NOTE — Telephone Encounter (Signed)
Called the patent regarding the upcoming visit. Stated she has to many app downloaded to her phone and would like to complete a telephone visit instead of a virtual. No questions at this time. Also informed of the new location.

## 2019-03-13 ENCOUNTER — Ambulatory Visit (INDEPENDENT_AMBULATORY_CARE_PROVIDER_SITE_OTHER): Payer: Medicaid Other | Admitting: Family Medicine

## 2019-03-13 ENCOUNTER — Other Ambulatory Visit: Payer: Self-pay

## 2019-03-13 DIAGNOSIS — O99213 Obesity complicating pregnancy, third trimester: Secondary | ICD-10-CM | POA: Diagnosis not present

## 2019-03-13 DIAGNOSIS — O283 Abnormal ultrasonic finding on antenatal screening of mother: Secondary | ICD-10-CM

## 2019-03-13 DIAGNOSIS — O09293 Supervision of pregnancy with other poor reproductive or obstetric history, third trimester: Secondary | ICD-10-CM

## 2019-03-13 DIAGNOSIS — O99210 Obesity complicating pregnancy, unspecified trimester: Secondary | ICD-10-CM

## 2019-03-13 DIAGNOSIS — Z79891 Long term (current) use of opiate analgesic: Secondary | ICD-10-CM

## 2019-03-13 DIAGNOSIS — O26893 Other specified pregnancy related conditions, third trimester: Secondary | ICD-10-CM

## 2019-03-13 DIAGNOSIS — G8929 Other chronic pain: Secondary | ICD-10-CM

## 2019-03-13 DIAGNOSIS — O09299 Supervision of pregnancy with other poor reproductive or obstetric history, unspecified trimester: Secondary | ICD-10-CM

## 2019-03-13 DIAGNOSIS — Z3A36 36 weeks gestation of pregnancy: Secondary | ICD-10-CM

## 2019-03-13 DIAGNOSIS — M549 Dorsalgia, unspecified: Secondary | ICD-10-CM

## 2019-03-13 DIAGNOSIS — O0990 Supervision of high risk pregnancy, unspecified, unspecified trimester: Secondary | ICD-10-CM

## 2019-03-13 DIAGNOSIS — O99810 Abnormal glucose complicating pregnancy: Secondary | ICD-10-CM

## 2019-03-13 MED ORDER — ACETAMINOPHEN-CODEINE 300-60 MG PO TABS: 1.0000 | ORAL_TABLET | ORAL | 0 refills | Status: DC | PRN

## 2019-03-13 NOTE — Progress Notes (Signed)
States does not have a blood pressure cuff and has not taken her blood pressure.

## 2019-03-13 NOTE — Progress Notes (Signed)
   TELEHEALTH VIRTUAL OBSTETRICS VISIT ENCOUNTER NOTE  I connected with Vanessa Nolan on 03/13/19 at  1:15 PM EDT by telephone at home and verified that I am speaking with the correct person using two identifiers.   I discussed the limitations, risks, security and privacy concerns of performing an evaluation and management service by telephone and the availability of in person appointments. I also discussed with the patient that there may be a patient responsible charge related to this service. The patient expressed understanding and agreed to proceed.  Subjective:  Vanessa Nolan is a 33 y.o. [id number] at 32w2dbeing followed for ongoing prenatal care.  She is currently monitored for the following issues for this high-risk pregnancy and has Supervision of high risk pregnancy, antepartum; [place]; History of pre-eclampsia in prior pregnancy, currently pregnant; Chronic back pain; History of depression; Hx of migraine headaches; Fetal 2cm subdiaphragmatic mass on ultrasound; Obesity in pregnancy; BMI 45.0-49.9, adult ([place]; Chronic prescription opiate use; Late prenatal care; History of preterm delivery; and Abnormal glucose affecting pregnancy on their problem list.  Patient reports increasing back pain. [place] . Reports fetal movement. Denies any contractions, bleeding or leaking of fluid.   The following portions of the patient's history were reviewed and updated as appropriate: allergies, current medications, past family history, past medical history, past social history, past surgical history and problem list.   Objective:   General:  Alert, oriented and cooperative.   Mental Status: Normal mood and affect perceived. Normal judgment and thought content.  Rest of physical exam deferred due to type of encounter  Assessment and Plan:  Pregnancy: [id number] 374w2d1. Supervision of high risk pregnancy, antepartum FHT and FH  normal  2. History of pre-eclampsia in prior pregnancy, currently pregnant No symptoms.  3. Obesity in pregnancy  4. Fetal 2cm subdiaphragmatic mass on ultrasound [place] f[place].  5. Chronic prescription opiate use  6. Chronic back pain, unspecified back location, unspecified back pain laterality On tylenol #4.  7. Abnormal glucose affecting pregnancy Fasting: 84-95 (1 elevated) 2hr PP: wide range of blood sugars, mostly dependent on diet.  Discussed adhering to diabetic diet  Preterm labor symptoms and general obstetric precautions including but not limited to vaginal bleeding, contractions, leaking of fluid and fetal movement were reviewed in detail with the patient.  I discussed the assessment and treatment plan with the patient. The patient was provided an opportunity to ask questions and all were answered. The patient agreed with the plan and demonstrated an understanding of the instructions. The patient was advised to call back or seek an in-person office evaluation/[facility] any urgent or concerning symptoms. Please refer to After Visit Summary for other counseling recommendations.   I provided 15 minutes of non-face-to-face time during this encounter.  Return in about 1 week (around 03/20/2019) for HR OB f/u, in office for GBS.  Future Appointments  Date Time Provider De[place]4/15/2020  2:00 PM StLadene ArtistMD [facility]4/22/2020  2:30 PM WH-MFC [place] [facility]

## 2019-03-13 NOTE — Progress Notes (Signed)
I connected with  Vanessa Nolan on 03/13/19 by a video enabled telemedicine application and verified that I am speaking with the correct person using two identifiers.   I discussed the limitations of evaluation and management by telemedicine. The patient expressed understanding and agreed to proceed.

## 2019-03-15 ENCOUNTER — Ambulatory Visit (INDEPENDENT_AMBULATORY_CARE_PROVIDER_SITE_OTHER): Payer: Medicaid Other | Admitting: Gastroenterology

## 2019-03-15 ENCOUNTER — Other Ambulatory Visit: Payer: Self-pay

## 2019-03-15 ENCOUNTER — Encounter: Payer: Self-pay | Admitting: Gastroenterology

## 2019-03-15 VITALS — Ht 66.5 in | Wt 285.0 lb

## 2019-03-15 DIAGNOSIS — M549 Dorsalgia, unspecified: Principal | ICD-10-CM

## 2019-03-15 DIAGNOSIS — K5190 Ulcerative colitis, unspecified, without complications: Secondary | ICD-10-CM

## 2019-03-15 DIAGNOSIS — G8929 Other chronic pain: Secondary | ICD-10-CM

## 2019-03-15 MED ORDER — MESALAMINE 1.2 G PO TBEC: 2.4000 g | DELAYED_RELEASE_TABLET | Freq: Two times a day (BID) | ORAL | 3 refills | Status: DC

## 2019-03-15 NOTE — Progress Notes (Signed)
History of Present Illness: This is a 33 year old female referred by Aletha Halim, MD for the evaluation of ulcerative colitis.  She is 45w4din a high risk pregnancy.  [place] with [place] in [place].  She relates that [facility] [place].  She states she underwent colonoscopy in 2018 that showed mild colitis.  She states she was initially diagnosed at age [age]and was hospitalized. She was treated with Remicade for a few months. Her disease substantially improved however she lost insurance and stopped Remicade. She has been maintained on Lialda for the past few years at 2.4g bid and states she has done well. She reduced her dose to 1.2 g bid during pregnancy and states she has done well without any active UC symptoms. Denies weight loss, abdominal pain, constipation, diarrhea, change in stool caliber, melena, hematochezia, nausea, vomiting, dysphagia, reflux symptoms, chest pain.    Allergies  Allergen Reactions  . Morphine And Related Other (See Comments)    tachycardia   Outpatient Medications Prior to Visit  Medication Sig Dispense Refill  . acetaminophen (TYLENOL) 500 MG tablet Take 500 mg by mouth every 6 (six) hours as needed.    .Marland Kitchenacetaminophen-codeine (TYLENOL #4) 300-60 MG tablet Take 1 tablet by mouth every 4 (four) hours as needed for pain. 60 tablet 0  . Blood Glucose Monitoring Suppl (ACCU-CHEK GUIDE) w/Device KIT 1 Device by Does not apply route 4 (four) times daily. 1 kit 0  . glucose blood (ACCU-CHEK GUIDE) test strip Use as instructed 50 each 12  . mesalamine (LIALDA) 1.2 g EC tablet Take 1.2 g by mouth daily with breakfast.    . omeprazole (PRILOSEC) 20 MG capsule Take 20 mg by mouth daily.    . Prenatal Vit-Fe Fumarate-FA (PREPLUS) 27-1 MG TABS Take 1 tablet by mouth daily. 30 tablet 3  . traMADol (ULTRAM) 50 MG tablet Take  by mouth every 6 (six) hours as needed.    . Accu-Chek FastClix Lancets MISC 1 each by Percutaneous route 4 (four) times daily. 100 each 12  . mesalamine (LIALDA) 1.2 g EC tablet Take 1 tablet (1.2 g total) by mouth 2 (two) times daily for 30 days. 60 tablet 0   No facility-administered medications prior to visit.    Past Medical History:  Diagnosis Date  . Depression   . Headache    Migraines  . Herniated disc, cervical   . Pregnancy induced hypertension 2015  . Preterm labor   . Ulcerative colitis ([facility]    Past Surgical History:  Procedure Laterality Date  . CHOLECYSTECTOMY    . COLONOSCOPY    . KNEE SURGERY    . WISDOM TOOTH EXTRACTION     Social History   Socioeconomic History  . Marital status: Single    Spouse name: Not on file  . Number of children: Not on file  . Years of education: Not on file  . Highest education level: Not on file  Occupational History  . Not on file  Social Needs  . Financial resource strain: Not on file  . Food insecurity:    Worry: Sometimes true    Inability: Sometimes true  . Transportation needs:    Medical: No    Non-medical: No  Tobacco Use  . Smoking status: Never Smoker  . Smokeless tobacco: Never Used  Substance and Sexual Activity  . Alcohol  use: Not Currently  . Drug use: Never  . Sexual activity: Yes    Birth control/protection: None  Lifestyle  . Physical activity:    Days per week: Not on file    Minutes per session: Not on file  . Stress: Not on file  Relationships  . Social connections:    Talks on phone: Not on file    Gets together: Not on file    Attends religious service: Not on file    Active member of club or organization: Not on file    Attends meetings of clubs or organizations: Not on file    Relationship status: Not on file  Other Topics Concern  . Not on file  Social History Narrative  . Not on file   Family History  Problem Relation Age of Onset  . Diabetes Father   . Cancer Brother         testicular      Review of Systems: Pertinent positive and negative review of systems were noted in the above HPI section. All other review of systems were otherwise negative.    Physical Exam: Telemedicine visit - not performed   Assessment and Recommendations:  1.  Ulcerative colitis which has been stable during pregnancy.  Will attempt to obtain records from her prior gastroenterologist, Dr. Deatra [place]a in [place], [place].  Per her current mgmt will continue Lialda 1.2g bid until delivery then increase to 2.4 g bid for long term maintenence. Call if symptoms flare. Determine timing of surveillance colonoscopy after records are reviewed. REV in 3 months.   2.  Pregnancy, high risk, [redacted]w[redacted]d Follow up with OB.  3.  BMI=45.    These services were provided via telemedicine, audio and visual.  The patient was at home and the provider was in the office, alone.  We discussed the limitations of evaluation and management by telemedicine and the availability of in person appointments.  Patient consented for this telemedicine visit and is aware of possible charges for this service.  The other person participating in the telemedicine service was AMarlon Pel CPrunedalewho reviewed medications, allergies, past history and completed AVS.  Time spent on call: 16 minutes   cc: PAletha Halim MD [id number] [id number]

## 2019-03-15 NOTE — Patient Instructions (Signed)
We have sent the following medications to your pharmacy for you to pick up at your convenience: Lialda.   Please mail back ROI to self addressed envelope.

## 2019-03-16 ENCOUNTER — Telehealth: Payer: Self-pay

## 2019-03-16 NOTE — Telephone Encounter (Signed)
Received fax PA from [facility] on patient's medication mesalamine 1.2 gm 2 capsules twice daily. Called [employer] to initate PA. PA is approved starting today through 03/10/2020. PA approval # [id number] and Ref # [id number]

## 2019-03-17 ENCOUNTER — Inpatient Hospital Stay (HOSPITAL_COMMUNITY)
Admission: AD | Admit: 2019-03-17 | Discharge: 2019-03-17 | Disposition: A | Discharge status: Home / Self Care | Payer: Medicaid Other | Attending: Obstetrics and Gynecology | Admitting: Obstetrics and Gynecology

## 2019-03-17 ENCOUNTER — Other Ambulatory Visit: Payer: Self-pay

## 2019-03-17 ENCOUNTER — Encounter (HOSPITAL_COMMUNITY): Payer: Self-pay | Admitting: *Deleted

## 2019-03-17 DIAGNOSIS — O09299 Supervision of pregnancy with other poor reproductive or obstetric history, unspecified trimester: Secondary | ICD-10-CM

## 2019-03-17 DIAGNOSIS — Z3A36 36 weeks gestation of pregnancy: Secondary | ICD-10-CM | POA: Insufficient documentation

## 2019-03-17 DIAGNOSIS — R030 Elevated blood-pressure reading, without diagnosis of hypertension: Secondary | ICD-10-CM | POA: Diagnosis not present

## 2019-03-17 DIAGNOSIS — O0990 Supervision of high risk pregnancy, unspecified, unspecified trimester: Secondary | ICD-10-CM

## 2019-03-17 DIAGNOSIS — O9989 Other specified diseases and conditions complicating pregnancy, childbirth and the puerperium: Secondary | ICD-10-CM | POA: Insufficient documentation

## 2019-03-17 DIAGNOSIS — O479 False labor, unspecified: Secondary | ICD-10-CM

## 2019-03-17 DIAGNOSIS — O4703 False labor before 37 completed weeks of gestation, third trimester: Secondary | ICD-10-CM | POA: Insufficient documentation

## 2019-03-17 HISTORY — DX: Unspecified asthma, uncomplicated: J45.909

## 2019-03-17 NOTE — MAU Provider Note (Signed)
None      S: Ms. Vanessa Nolan is a 33 y.o. [id number] at [redacted]w[redacted]d  who presents to MAU today complaining contractions since yesterday. She denies vaginal bleeding. She denies LOF. She reports normal fetal movement.    O: BP (!) 141/77   Pulse (!) 102   Temp 98.4 F (36.9 C) (Oral)   Resp 18   Wt 129.8 kg   LMP 07/16/2018 (Exact Date) Comment: 4 months pregnant, lead shielding provided//a.c.  SpO2 97%   BMI 45.49 kg/m    Vitals:   03/17/19 1408 03/17/19 1437 03/17/19 1557  BP: (!) 149/85 (!) 141/77 139/78  Pulse: 93 (!) 102 87  Resp: 18  18  Temp: 98.4 F (36.9 C)    TempSrc: Oral    SpO2: 97%    Weight: 129.8 kg      GENERAL: Well-developed, well-nourished female in no acute distress.  HEAD: Normocephalic, atraumatic.  CHEST: Normal effort of breathing, regular heart rate ABDOMEN: Soft, nontender, gravid  Cervical exam:  Dilation: 1 Effacement (%): Thick Cervical Position: Posterior Station: Ballotable Exam by:: Dorrene German RN   Fetal Monitoring: Baseline: 145 Variability: Moderate Accelerations: Present Decelerations: None Contractions: None graphed, Irritability Noted   A: SIUP at [redacted]w[redacted]d  False labor  Elevated BP  P: Repeat BP with improvement. Will hold off on [facility] labs as patient asymptomatic. NOB PC Ratio .15 on 01/20/2019 Discharge to home with labor and PreEclampsia precautions.  Gerrit Heck, CNM 03/17/2019 3:42 PM

## 2019-03-17 NOTE — Discharge Instructions (Signed)
Hypertension During Pregnancy ° °Hypertension, commonly called high blood pressure, is when the force of blood pumping through your arteries is too strong. Arteries are blood vessels that carry blood from the heart throughout the body. Hypertension during pregnancy can cause problems for you and your baby. Your baby may be born early (prematurely) or may not weigh as much as he or she should at birth. Very bad cases of hypertension during pregnancy can be life-threatening. °Different types of hypertension can occur during pregnancy. These include: °· Chronic hypertension. This happens when: °? You have hypertension before pregnancy and it continues during pregnancy. °? You develop hypertension before you are [redacted] weeks pregnant, and it continues during pregnancy. °· Gestational hypertension. This is hypertension that develops after the 20th week of pregnancy. °· Preeclampsia, also called toxemia of pregnancy. This is a very serious type of hypertension that develops during pregnancy. It can be very dangerous for you and your baby. °? In rare cases, you may develop preeclampsia after giving birth (postpartum preeclampsia). This usually occurs within 48 hours after childbirth but may occur up to 6 weeks after giving birth. °Gestational hypertension and preeclampsia usually go away within 6 weeks after your baby is born. Women who have hypertension during pregnancy have a greater chance of developing hypertension later in life or during future pregnancies. °What are the causes? °The exact cause of hypertension during pregnancy is not known. °What increases the risk? °There are certain factors that make it more likely for you to develop hypertension during pregnancy. These include: °· Having hypertension during a previous pregnancy or prior to pregnancy. °· Being overweight. °· Being age 35 or older. °· Being pregnant for the first time. °· Being pregnant with more than one baby. °· Becoming pregnant using fertilization  methods such as IVF (in vitro fertilization). °· Having diabetes, kidney problems, or systemic lupus erythematosus. °· Having a family history of hypertension. °What are the signs or symptoms? °Chronic hypertension and gestational hypertension rarely cause symptoms. Preeclampsia causes symptoms, which may include: °· Increased protein in your urine. Your health care provider will check for this at every visit before you give birth (prenatal visit). °· Severe headaches. °· Sudden weight gain. °· Swelling of the hands, face, legs, and feet. °· Nausea and vomiting. °· Vision problems, such as blurred or double vision. °· Numbness in the face, arms, legs, and feet. °· Dizziness. °· Slurred speech. °· Sensitivity to bright lights. °· Abdominal pain. °· Convulsions or seizures. °How is this diagnosed? °You may be diagnosed with hypertension during a routine prenatal exam. At each prenatal visit, you may: °· Have a urine test to check for high amounts of protein in your urine. °· Have your blood pressure checked. A blood pressure reading is given as two numbers, such as "120 over 80" (or 120/80). The first ("top") number is a measure of the pressure in your arteries when your heart beats (systolic pressure). The second ("bottom") number is a measure of the pressure in your arteries as your heart relaxes between beats (diastolic pressure). Blood pressure is measured in a unit called mm Hg. For most women, a normal blood pressure reading is: °? Systolic: below 120. °? Diastolic: below 80. °The type of hypertension that you are diagnosed with depends on your test results and when your symptoms developed. °· Chronic hypertension is usually diagnosed before 20 weeks of pregnancy. °· Gestational hypertension is usually diagnosed after 20 weeks of pregnancy. °· Hypertension with high amounts of protein in   the urine is diagnosed as preeclampsia. °· Blood pressure measurements that stay above 160 systolic, or above 110 diastolic,  are signs of severe preeclampsia. °How is this treated? °Treatment for hypertension during pregnancy varies depending on the type of hypertension you have and how serious it is. °· If you take medicines called ACE inhibitors to treat chronic hypertension, you may need to switch medicines. ACE inhibitors should not be taken during pregnancy. °· If you have gestational hypertension, you may need to take blood pressure medicine. °· If you are at risk for preeclampsia, your health care provider may recommend that you take a low-dose aspirin during your pregnancy. °· If you have severe preeclampsia, you may need to be hospitalized so you and your baby can be monitored closely. You may also need to take medicine (magnesium sulfate) to prevent seizures and to lower blood pressure. This medicine may be given as an injection or through an IV. °· In some cases, if your condition gets worse, you may need to deliver your baby early. °Follow these instructions at home: °Eating and drinking ° °· Drink enough fluid to keep your urine pale yellow. °· Avoid caffeine. °Lifestyle °· Do not use any products that contain nicotine or tobacco, such as cigarettes and e-cigarettes. If you need help quitting, ask your health care provider. °· Do not use alcohol or drugs. °· Avoid stress as much as possible. Rest and get plenty of sleep. °General instructions °· Take over-the-counter and prescription medicines only as told by your health care provider. °· While lying down, lie on your left side. This keeps pressure off your major blood vessels. °· While sitting or lying down, raise (elevate) your feet. Try putting some pillows under your lower legs. °· Exercise regularly. Ask your health care provider what kinds of exercise are best for you. °· Keep all prenatal and follow-up visits as told by your health care provider. This is important. °Contact a health care provider if: °· You have symptoms that your health care provider told you may  require more treatment or monitoring, such as: °? Nausea or vomiting. °? Headache. °Get help right away if you have: °· Severe abdominal pain that does not get better with treatment. °· A severe headache that does not get better. °· Vomiting that does not get better. °· Sudden, rapid weight gain. °· Sudden swelling in your hands, ankles, or face. °· Vaginal bleeding. °· Blood in your urine. °· Fewer movements from your baby than usual. °· Blurred or double vision. °· Muscle twitching or sudden muscle tightening (spasms). °· Shortness of breath. °· Blue fingernails or lips. °Summary °· Hypertension, commonly called high blood pressure, is when the force of blood pumping through your arteries is too strong. °· Hypertension during pregnancy can cause problems for you and your baby. °· Treatment for hypertension during pregnancy varies depending on the type of hypertension you have and how serious it is. °· Get help right away if you have symptoms that your health care provider told you to watch for. °This information is not intended to replace advice given to you by your health care provider. Make sure you discuss any questions you have with your health care provider. °Document Released: 08/04/2011 Document Revised: 11/02/2017 Document Reviewed: 05/01/2016 °Elsevier Interactive Patient Education © 2019 [employer] ° °

## 2019-03-17 NOTE — MAU Note (Signed)
Contractions started yesterday, not super bad, but not going away.  Back is killing her. No bleeding or leaking.

## 2019-03-20 ENCOUNTER — Other Ambulatory Visit: Payer: Self-pay | Admitting: Family Medicine

## 2019-03-20 ENCOUNTER — Encounter: Payer: Self-pay | Admitting: *Deleted

## 2019-03-20 MED ORDER — ACETAMINOPHEN-CODEINE #3 300-30 MG PO TABS: 1.0000 | ORAL_TABLET | Freq: Two times a day (BID) | ORAL | 0 refills | Status: DC | PRN

## 2019-03-20 MED ORDER — OMEPRAZOLE 20 MG PO CPDR: 40.0000 mg | DELAYED_RELEASE_CAPSULE | Freq: Every day | ORAL | 1 refills | Status: AC

## 2019-03-20 NOTE — Progress Notes (Signed)
pril

## 2019-03-22 ENCOUNTER — Ambulatory Visit: Payer: Medicaid Other | Admitting: Obstetrics and Gynecology

## 2019-03-22 ENCOUNTER — Telehealth: Payer: Self-pay | Admitting: Obstetrics and Gynecology

## 2019-03-22 ENCOUNTER — Encounter (HOSPITAL_COMMUNITY): Payer: Self-pay

## 2019-03-22 ENCOUNTER — Other Ambulatory Visit: Payer: Self-pay

## 2019-03-22 ENCOUNTER — Ambulatory Visit (HOSPITAL_COMMUNITY)
Admission: RE | Admit: 2019-03-22 | Discharge: 2019-03-22 | Disposition: A | Discharge status: Home / Self Care | Payer: Medicaid Other | Source: Ambulatory Visit | Attending: Obstetrics and Gynecology | Admitting: Obstetrics and Gynecology

## 2019-03-22 DIAGNOSIS — O0990 Supervision of high risk pregnancy, unspecified, unspecified trimester: Secondary | ICD-10-CM

## 2019-03-22 DIAGNOSIS — Q332 Sequestration of lung: Secondary | ICD-10-CM

## 2019-03-22 DIAGNOSIS — O09299 Supervision of pregnancy with other poor reproductive or obstetric history, unspecified trimester: Secondary | ICD-10-CM

## 2019-03-22 NOTE — Telephone Encounter (Signed)
Called the patient to inform of upcoming scheduled appointment. The patient verbalized understanding.

## 2019-03-22 NOTE — ED Notes (Signed)
Pt here today in [facility] for U/S.  Pt left before having ultrasound stating she couldn't stay any longer because her back hurts.

## 2019-04-04 ENCOUNTER — Other Ambulatory Visit: Payer: Self-pay

## 2019-04-04 ENCOUNTER — Encounter (HOSPITAL_COMMUNITY): Payer: Self-pay | Admitting: *Deleted

## 2019-04-04 ENCOUNTER — Inpatient Hospital Stay (HOSPITAL_COMMUNITY)
Admission: AD | Admit: 2019-04-04 | Discharge: 2019-04-05 | Disposition: A | Discharge status: Home / Self Care | Payer: Medicaid Other | Attending: Family Medicine | Admitting: Family Medicine

## 2019-04-04 DIAGNOSIS — Z3A39 39 weeks gestation of pregnancy: Secondary | ICD-10-CM | POA: Diagnosis not present

## 2019-04-04 DIAGNOSIS — G8929 Other chronic pain: Secondary | ICD-10-CM | POA: Diagnosis not present

## 2019-04-04 DIAGNOSIS — M545 Low back pain: Secondary | ICD-10-CM | POA: Insufficient documentation

## 2019-04-04 DIAGNOSIS — Z8719 Personal history of other diseases of the digestive system: Secondary | ICD-10-CM | POA: Diagnosis not present

## 2019-04-04 DIAGNOSIS — Z3A37 37 weeks gestation of pregnancy: Secondary | ICD-10-CM | POA: Diagnosis not present

## 2019-04-04 DIAGNOSIS — Z79891 Long term (current) use of opiate analgesic: Secondary | ICD-10-CM

## 2019-04-04 DIAGNOSIS — O26893 Other specified pregnancy related conditions, third trimester: Secondary | ICD-10-CM | POA: Insufficient documentation

## 2019-04-04 DIAGNOSIS — M549 Dorsalgia, unspecified: Secondary | ICD-10-CM | POA: Diagnosis not present

## 2019-04-04 DIAGNOSIS — Z79899 Other long term (current) drug therapy: Secondary | ICD-10-CM | POA: Diagnosis not present

## 2019-04-04 DIAGNOSIS — Z885 Allergy status to narcotic agent status: Secondary | ICD-10-CM | POA: Insufficient documentation

## 2019-04-04 DIAGNOSIS — Z658 Other specified problems related to psychosocial circumstances: Secondary | ICD-10-CM

## 2019-04-04 MED ORDER — CYCLOBENZAPRINE HCL 10 MG PO TABS
10.0000 mg | ORAL_TABLET | Freq: Once | ORAL | Status: AC
  Administered 2019-04-04: 10 mg via ORAL
  Filled 2019-04-04: qty 1

## 2019-04-04 NOTE — MAU Note (Signed)
Pt c/o chronic low back pain with several buldging and/or herniated discs. She has been on Tylenol #4 and tramadol to help  manage the pain. She only has tylenol #3 currently that she takes with tramadol BID and she says that the pain is awful. She contacted her doctor yesterday  and was told to come to the hospital b/c he was not comfortable giving her more medicine.  States decreased movement over the past few days. Denies vag bleeding but reports increased clear discharge over the past 5 weeks.

## 2019-04-04 NOTE — MAU Provider Note (Signed)
Chief Complaint:  Back Pain   First Provider Initiated Contact with Patient 04/04/19 2315      HPI: Vanessa Nolan is a 33 y.o. [id number] at 14w3dho presents to maternity admissions reporting severe low back pain.  Has known bulging discs, [facility] [place] [facility]    States Was on Gabapentin with Tylenol #4 but stopped Gabapentin due to pregnancy and was changed to Tylenol #3.  Tramadol was added.  States is supposed to take 2 tabs TID, but has been taking 4 Tramadol with Tylenol. In past was on Dilaudid for Ulcerative Colitis.     Has Rx for Robaxin but never tried it.  Was having success with Chiropractic but WC won't pay for it.  Has never tried PT for this issue. .  She reports good fetal movement, denies LOF, vaginal bleeding, vaginal itching/burning, urinary symptoms, h/a, dizziness, n/v, diarrhea, constipation or fever/chills.  She denies headache, visual changes or RUQ abdominal pain.  RN Note: Pt c/o chronic low back pain with several buldging and/or herniated discs. She has been on Tylenol #4 and tramadol to help  manage the pain. She only has tylenol #3 currently that she takes with tramadol BID and she says that the pain is awful. She contacted her doctor yesterday  and was told to come to the hospital b/c he was not comfortable giving her more medicine.  States decreased movement over the past few days. Denies vag bleeding but reports increased clear discharge over the past 5 weeks.  Past Medical History: Past Medical History:  Diagnosis Date  . Asthma   . Depression   . Headache    Migraines  . Herniated disc, cervical   . Pregnancy induced hypertension 2015  . Preterm labor   . Ulcerative colitis ([place]     Past obstetric history: OB History  Gravida Para Term Preterm AB Living  _0 SAB TAB Ectopic Multiple Live Births  1       4    # Outcome Date GA Lbr Len/2nd Weight Sex Delivery Anes PTL Lv  6 Current           5 Preterm 10/04/14 3331w0d 2296 g F Vag-Spont None N LIV     Complications: Pre-eclampsia  4 Term 03/28/13 [id number] g M Vag-Spont None N LIV  3 Term 04/22/12 [id number] g M Vag-Spont EPI N LIV  2 SAB 02/27/11 [id number]     1 Term 08/11/08 53w4d68w4d g F Vag-Spont EPI N LIV    Past Surgical History: Past Surgical History:  Procedure Laterality Date  . CHOLECYSTECTOMY    . COLONOSCOPY    . KNEE SURGERY    . WISDOM TOOTH EXTRACTION      Family History: Family History  Problem Relation Age of Onset  . Diabetes Father   . Cancer Brother        testicular    Social History: Social History   Tobacco Use  . Smoking status: Never Smoker  . Smokeless tobacco: Never Used  Substance Use Topics  . Alcohol use: Not Currently  . Drug use: Never    Allergies:  Allergies  Allergen Reactions  . Morphine And Related Other (See Comments)    tachycardia    Meds:  Medications Prior to Admission  Medication Sig Dispense Refill Last Dose  . Accu-Chek FastClix Lancets MISC 1 each by Percutaneous route 4 (four) times  daily. 100 each 12 Past Week at Unknown time  . acetaminophen (TYLENOL) 500 MG tablet Take 500 mg by mouth every 6 (six) hours as needed.   04/04/2019 at Unknown time  . acetaminophen-codeine (TYLENOL #3) 300-30 MG tablet Take 1 tablet by mouth 2 (two) times daily as needed for moderate pain. 60 tablet 0 04/03/2019 at Unknown time  . Blood Glucose Monitoring Suppl (ACCU-CHEK GUIDE) w/Device KIT 1 Device by Does not apply route 4 (four) times daily. 1 kit 0 Past Week at Unknown time  . glucose blood (ACCU-CHEK GUIDE) test strip Use as instructed 50 each 12 Past Week at Unknown time  . mesalamine (LIALDA) 1.2 g EC tablet Take 2 tablets (2.4 g total) by mouth 2 (two) times daily. 120 tablet 3 04/03/2019 at Unknown time  . omeprazole (PRILOSEC) 20 MG capsule Take 2 capsules (40 mg total) by mouth daily. 90 capsule 1 04/04/2019 at Unknown time  . Prenatal Vit-Fe Fumarate-FA (PREPLUS) 27-1 MG TABS Take 1  tablet by mouth daily. 30 tablet 3 04/04/2019 at Unknown time  . traMADol (ULTRAM) 50 MG tablet Take by mouth every 6 (six) hours as needed.   04/04/2019 at Unknown time  . mesalamine (LIALDA) 1.2 g EC tablet Take 1 tablet (1.2 g total) by mouth 2 (two) times daily for 30 days. 60 tablet 0 Taking    I have reviewed patient's Past Medical Hx, Surgical Hx, Family Hx, Social Hx, medications and allergies.   ROS:  Review of Systems  Constitutional: Negative for chills and fever.  Respiratory: Negative for shortness of breath.   Gastrointestinal: Negative for abdominal pain, constipation, diarrhea and nausea.  Genitourinary: Negative for vaginal bleeding.  Musculoskeletal: Positive for back pain.   Other systems negative  Physical Exam   Patient Vitals for the past 24 hrs:  BP Temp Temp src Pulse Resp Height Weight  04/04/19 2252 136/85 98.5 F (36.9 C) Oral (!) 116 19 _0  (1.676 m) 131.1 kg   Constitutional: Well-developed, well-nourished female in no acute distress.  Cardiovascular: normal rate and rhythm Respiratory: normal effort, clear to auscultation bilaterally GI: Abd soft, non-tender, gravid appropriate for gestational age.   No rebound or guarding. MS: Extremities nontender, no edema, normal ROM  States back is tender all the time Neurologic: Alert and oriented x 4.  GU: Neg CVAT.  PELVIC EXAM: deferred  FHT:  Baseline 140 , moderate variability, accelerations present, no decelerations Contractions: Occasional and  Irregular    Labs: No results found for this or any previous visit (from the past 24 hour(s)).  A/Positive/-- (02/21 1151)  Imaging:  No results found.  MAU Course/MDM: NST reviewed and is reactive with rare contractions Discussed history of back pain and former and current treatments.  Has not tried any muscle relaxants as she wasn't sure about safety   Agrees to try Flexeril  Had some relief from Flexeril.  Discussed may benefit from Physical Therapy.   Needs to keep appt for 5/7 for office appt.  States cannot go that day due to no child care.  Had an appointment a week or so ago but left the office after "waiting an hour and was about to pass out from the pain, so I left".  States husband is off today (5/6) and wants to come today.  Informed it may be impossible as she had to be overbooked for the next day. Will message office to see.  She is to call and ask.  Had lots of questions  about Medicaid coverage, doctor for [employer] here,  and problems with childcare.  I asked what she planned for when she goes into labor and she states her neighbor might be able to keep kids but she would have to pay for it.  Has no other options. I suggested she talk to them in office about getting advice from SW on what to do.   Consult Dr Nehemiah Settle with presentation, exam findings and test results.  Treatments in MAU included Flexeril.    Assessment: Single intrauterine pregnancy at 8w4dChronic back pain  Plan: Discharge home Rx Flexeril for PRN use for back spasm Might consider physical therapy, at least postpartum [facility] [place] Follow up in Office for prenatal visits and recheck  Encouraged to return here or to other Urgent Care/ED if she develops worsening of symptoms, increase in pain, fever, or other concerning symptoms.   Pt stable at time of discharge.  MHansel FeinsteinCNM, MSN Certified Nurse-Midwife 04/04/2019 11:15 PM

## 2019-04-05 ENCOUNTER — Encounter (HOSPITAL_COMMUNITY): Payer: Self-pay | Admitting: Advanced Practice Midwife

## 2019-04-05 MED ORDER — CYCLOBENZAPRINE HCL 10 MG PO TABS: 10.0000 mg | ORAL_TABLET | Freq: Three times a day (TID) | ORAL | 0 refills | Status: DC | PRN

## 2019-04-05 NOTE — Discharge Instructions (Signed)
Acute Back Pain, Adult Acute back pain is sudden and usually short-lived. It is often caused by an injury to the muscles and tissues in the back. The injury may result from:  A muscle or ligament getting overstretched or torn (strained). Ligaments are tissues that connect bones to each other. Lifting something improperly can cause a back strain.  Wear and tear (degeneration) of the spinal disks. Spinal disks are circular tissue that provides cushioning between the bones of the spine (vertebrae).  Twisting motions, such as while playing sports or doing yard work.  A hit to the back.  Arthritis. You may have a physical exam, lab tests, and imaging tests to find the cause of your pain. Acute back pain usually goes away with rest and home care. Follow these instructions at home: Managing pain, stiffness, and swelling  Take over-the-counter and prescription medicines only as told by your health care provider.  Your health care provider may recommend applying ice during the first 24-48 hours after your pain starts. To do this: ? Put ice in a plastic bag. ? Place a towel between your skin and the bag. ? Leave the ice on for 20 minutes, 2-3 times a day.  If directed, apply heat to the affected area as often as told by your health care provider. Use the heat source that your health care provider recommends, such as a moist heat pack or a heating pad. ? Place a towel between your skin and the heat source. ? Leave the heat on for 20-30 minutes. ? Remove the heat if your skin turns bright red. This is especially important if you are unable to feel pain, heat, or cold. You have a greater risk of getting burned. Activity   Do not stay in bed. Staying in bed for more than 1-2 days can delay your recovery.  Sit up and stand up straight. Avoid leaning forward when you sit, or hunching over when you stand. ? If you work at a desk, sit close to it so you do not need to lean over. Keep your chin tucked  in. Keep your neck drawn back, and keep your elbows bent at a right angle. Your arms should look like the letter "L." ? Sit high and close to the steering wheel when you drive. Add lower back (lumbar) support to your car seat, if needed.  Take short walks on even surfaces as soon as you are able. Try to increase the length of time you walk each day.  Do not sit, drive, or stand in one place for more than 30 minutes at a time. Sitting or standing for long periods of time can put stress on your back.  Do not drive or use heavy machinery while taking prescription pain medicine.  Use proper lifting techniques. When you bend and lift, use positions that put less stress on your back: ? [place] your knees. ? Keep the load close to your body. ? Avoid twisting.  Exercise regularly as told by your health care provider. Exercising helps your back heal faster and helps prevent back injuries by keeping muscles strong and flexible.  Work with a physical therapist to make a safe exercise program, as recommended by your health care provider. Do any exercises as told by your physical therapist. Lifestyle  Maintain a healthy weight. Extra weight puts stress on your back and makes it difficult to have good posture.  Avoid activities or situations that make you feel anxious or stressed. Stress and anxiety increase muscle  tension and can make back pain worse. Learn ways to manage anxiety and stress, such as through exercise. General instructions  Sleep on a firm mattress in a comfortable position. Try lying on your side with your knees slightly bent. If you lie on your back, put a pillow under your knees.  Follow your treatment plan as told by your health care provider. This may include: ? Cognitive or behavioral therapy. ? Acupuncture or massage therapy. ? Meditation or yoga. Contact a health care provider if:  You have pain that is not relieved with rest or medicine.  You have increasing pain going down  into your legs or buttocks.  Your pain does not improve after 2 weeks.  You have pain at night.  You lose weight without trying.  You have a fever or chills. Get help right away if:  You develop new bowel or bladder control problems.  You have unusual weakness or numbness in your arms or legs.  You develop nausea or vomiting.  You develop abdominal pain.  You feel faint. Summary  Acute back pain is sudden and usually short-lived.  Use proper lifting techniques. When you bend and lift, use positions that put less stress on your back.  Take over-the-counter and prescription medicines and apply heat or ice as directed by your health care provider. This information is not intended to replace advice given to you by your health care provider. Make sure you discuss any questions you have with your health care provider. Document Released: 11/16/2005 Document Revised: 06/23/2018 Document Reviewed: 06/30/2017 Elsevier Interactive Patient Education  2019 [employer].  Third Trimester of Pregnancy The third trimester is from week 28 through week 40 (months 7 through 9). The third trimester is a time when the unborn baby (fetus) is growing rapidly. At the end of the ninth month, the fetus is about 20 inches in length and weighs 6-10 pounds. Body changes during your third trimester Your body will continue to go through many changes during pregnancy. The changes vary from woman to woman. During the third trimester:  Your weight will continue to increase. You can expect to gain 25-35 pounds (11-16 kg) by the end of the pregnancy.  You may begin to get stretch marks on your hips, abdomen, and breasts.  You may urinate more often because the fetus is moving lower into your pelvis and pressing on your bladder.  You may develop or continue to have heartburn. This is caused by increased hormones that slow down muscles in the digestive tract.  You may develop or continue to have constipation  because increased hormones slow digestion and cause the muscles that push waste through your intestines to relax.  You may develop hemorrhoids. These are swollen veins (varicose veins) in the rectum that can itch or be painful.  You may develop swollen, bulging veins (varicose veins) in your legs.  You may have increased body aches in the pelvis, back, or thighs. This is due to weight gain and increased hormones that are relaxing your joints.  You may have changes in your hair. These can include thickening of your hair, rapid growth, and changes in texture. Some women also have hair loss during or after pregnancy, or hair that feels dry or thin. Your hair will most likely return to normal after your baby is born.  Your breasts will continue to grow and they will continue to become tender. A yellow fluid (colostrum) may leak from your breasts. This is the first milk you are  producing for your baby.  Your belly button may stick out.  You may notice more swelling in your hands, face, or ankles.  You may have increased tingling or numbness in your hands, arms, and legs. The skin on your belly may also feel numb.  You may feel short of breath because of your expanding uterus.  You may have more problems sleeping. This can be caused by the size of your belly, increased need to urinate, and an increase in your body's metabolism.  You may notice the fetus "dropping," or moving lower in your abdomen (lightening).  You may have increased vaginal discharge.  You may notice your joints feel loose and you may have pain around your pelvic bone. What to expect at prenatal visits You will have prenatal exams every 2 weeks until week 36. Then you will have weekly prenatal exams. During a routine prenatal visit:  You will be weighed to make sure you and the baby are growing normally.  Your blood pressure will be taken.  Your abdomen will be measured to track your baby's growth.  The fetal heartbeat  will be listened to.  Any test results from the previous visit will be discussed.  You may have a cervical check near your due date to see if your cervix has softened or thinned (effaced).  You will be tested for Group B streptococcus. This happens between 35 and 37 weeks. Your health care provider may ask you:  What your birth plan is.  How you are feeling.  If you are feeling the baby move.  If you have had any abnormal symptoms, such as leaking fluid, bleeding, severe headaches, or abdominal cramping.  If you are using any tobacco products, including cigarettes, chewing tobacco, and electronic cigarettes.  If you have any questions. Other tests or screenings that may be performed during your third trimester include:  Blood tests that check for low iron levels (anemia).  Fetal testing to check the health, activity level, and growth of the fetus. Testing is done if you have certain medical conditions or if there are problems during the pregnancy.  Nonstress test (NST). This test checks the health of your baby to make sure there are no signs of problems, such as the baby not getting enough oxygen. During this test, a belt is placed around your belly. The baby is made to move, and its heart rate is monitored during movement. What is false labor? False labor is a condition in which you feel small, irregular tightenings of the muscles in the womb (contractions) that usually go away with rest, changing position, or drinking water. These are called Braxton Hicks contractions. Contractions may last for hours, days, or even weeks before true labor sets in. If contractions come at regular intervals, become more frequent, increase in intensity, or become painful, you should see your health care provider. What are the signs of labor?  Abdominal cramps.  Regular contractions that start at 10 minutes apart and become stronger and more frequent with time.  Contractions that start on the top of  the uterus and spread down to the lower abdomen and back.  Increased pelvic pressure and dull back pain.  A watery or bloody mucus discharge that comes from the vagina.  Leaking of amniotic fluid. This is also known as your "water breaking." It could be a slow trickle or a gush. Let your health care provider know if it has a color or strange odor. If you have any of these signs,  call your health care provider right away, even if it is before your due date. Follow these instructions at home: Medicines  Follow your health care provider's instructions regarding medicine use. Specific medicines may be either safe or unsafe to take during pregnancy.  Take a prenatal vitamin that contains at least 600 micrograms (mcg) of folic acid.  If you develop constipation, try taking a stool softener if your health care provider approves. Eating and drinking   Eat a balanced diet that includes fresh fruits and vegetables, whole grains, good sources of protein such as meat, eggs, or tofu, and low-fat dairy. Your health care provider will help you determine the amount of weight gain that is right for you.  Avoid raw meat and uncooked cheese. These carry germs that can cause birth defects in the baby.  If you have low calcium intake from food, talk to your health care provider about whether you should take a daily calcium supplement.  Eat four or five small meals rather than three large meals a day.  Limit foods that are high in fat and processed sugars, such as fried and sweet foods.  To prevent constipation: ? Drink enough fluid to keep your urine clear or pale yellow. ? Eat foods that are high in fiber, such as fresh fruits and vegetables, whole grains, and beans. Activity  Exercise only as directed by your health care provider. Most women can continue their usual exercise routine during pregnancy. Try to exercise for 30 minutes at least 5 days a week. Stop exercising if you experience uterine  contractions.  Avoid heavy lifting.  Do not exercise in extreme heat or humidity, or at high altitudes.  Wear low-heel, comfortable shoes.  Practice good posture.  You may continue to have sex unless your health care provider tells you otherwise. Relieving pain and discomfort  Take frequent breaks and rest with your legs elevated if you have leg cramps or low back pain.  Take warm sitz baths to soothe any pain or discomfort caused by hemorrhoids. Use hemorrhoid cream if your health care provider approves.  Wear a good support bra to prevent discomfort from breast tenderness.  If you develop varicose veins: ? Wear support pantyhose or compression stockings as told by your healthcare provider. ? Elevate your feet for 15 minutes, 3-4 times a day. Prenatal care  Write down your questions. Take them to your prenatal visits.  Keep all your prenatal visits as told by your health care provider. This is important. Safety  Wear your seat belt at all times when driving.  Make a list of emergency phone numbers, including numbers for family, friends, the hospital, and police and fire departments. General instructions  Avoid cat litter boxes and soil used by cats. These carry germs that can cause birth defects in the baby. If you have a cat, ask someone to clean the litter box for you.  Do not travel far distances unless it is absolutely necessary and only with the approval of your health care provider.  Do not use hot tubs, steam rooms, or saunas.  Do not drink alcohol.  Do not use any products that contain nicotine or tobacco, such as cigarettes and e-cigarettes. If you need help quitting, ask your health care provider.  Do not use any medicinal herbs or unprescribed drugs. These chemicals affect the formation and growth of the baby.  Do not douche or use tampons or scented sanitary pads.  Do not cross your legs for long periods of time.  To prepare for the arrival of your  baby: ? Take prenatal classes to understand, practice, and ask questions about labor and delivery. ? Make a trial run to the hospital. ? Visit the hospital and tour the maternity area. ? Arrange for maternity or paternity leave through employers. ? Arrange for family and friends to take care of pets while you are in the hospital. ? Purchase a rear-facing car seat and make sure you know how to install it in your car. ? Pack your hospital bag. ? Prepare the babys nursery. Make sure to remove all pillows and stuffed animals from the baby's crib to prevent suffocation.  Visit your dentist if you have not gone during your pregnancy. Use a soft toothbrush to brush your teeth and be gentle when you floss. Contact a health care provider if:  You are unsure if you are in labor or if your water has broken.  You become dizzy.  You have mild pelvic cramps, pelvic pressure, or nagging pain in your abdominal area.  You have lower back pain.  You have persistent nausea, vomiting, or diarrhea.  You have an unusual or bad smelling vaginal discharge.  You have pain when you urinate. Get help right away if:  Your water breaks before 37 weeks.  You have regular contractions less than 5 minutes apart before 37 weeks.  You have a fever.  You are leaking fluid from your vagina.  You have spotting or bleeding from your vagina.  You have severe abdominal pain or cramping.  You have rapid weight loss or weight gain.  You have shortness of breath with chest pain.  You notice sudden or extreme swelling of your face, hands, ankles, feet, or legs.  Your baby makes fewer than 10 movements in 2 hours.  You have severe headaches that do not go away when you take medicine.  You have vision changes. Summary  The third trimester is from week 28 through week 40, months 7 through 9. The third trimester is a time when the unborn baby (fetus) is growing rapidly.  During the third trimester, your  discomfort may increase as you and your baby continue to gain weight. You may have abdominal, leg, and back pain, sleeping problems, and an increased need to urinate.  During the third trimester your breasts will keep growing and they will continue to become tender. A yellow fluid (colostrum) may leak from your breasts. This is the first milk you are producing for your baby.  False labor is a condition in which you feel small, irregular tightenings of the muscles in the womb (contractions) that eventually go away. These are called Braxton Hicks contractions. Contractions may last for hours, days, or even weeks before true labor sets in.  Signs of labor can include: abdominal cramps; regular contractions that start at 10 minutes apart and become stronger and more frequent with time; watery or bloody mucus discharge that comes from the vagina; increased pelvic pressure and dull back pain; and leaking of amniotic fluid. This information is not intended to replace advice given to you by your health care provider. Make sure you discuss any questions you have with your health care provider. Document Released: 11/10/2001 Document Revised: 12/22/2016 Document Reviewed: 12/22/2016 Elsevier Interactive Patient Education  2019 [employer].

## 2019-04-06 ENCOUNTER — Encounter: Payer: Self-pay | Admitting: Obstetrics and Gynecology

## 2019-04-06 ENCOUNTER — Ambulatory Visit (INDEPENDENT_AMBULATORY_CARE_PROVIDER_SITE_OTHER): Payer: Medicaid Other | Admitting: Obstetrics and Gynecology

## 2019-04-06 ENCOUNTER — Other Ambulatory Visit: Payer: Self-pay

## 2019-04-06 ENCOUNTER — Telehealth: Payer: Self-pay | Admitting: Obstetrics and Gynecology

## 2019-04-06 VITALS — BP 115/63 | HR 124 | Wt 288.0 lb

## 2019-04-06 DIAGNOSIS — O99810 Abnormal glucose complicating pregnancy: Secondary | ICD-10-CM | POA: Diagnosis not present

## 2019-04-06 DIAGNOSIS — O0993 Supervision of high risk pregnancy, unspecified, third trimester: Secondary | ICD-10-CM | POA: Diagnosis present

## 2019-04-06 DIAGNOSIS — Z3A39 39 weeks gestation of pregnancy: Secondary | ICD-10-CM

## 2019-04-06 DIAGNOSIS — O0990 Supervision of high risk pregnancy, unspecified, unspecified trimester: Secondary | ICD-10-CM

## 2019-04-06 NOTE — Progress Notes (Signed)
   PRENATAL VISIT NOTE  Subjective:  Vanessa Nolan is a 33 y.o. [id number] at 103w5dbeing seen today for ongoing prenatal care.  She is currently monitored for the following issues for this high-risk pregnancy and has Supervision of high risk pregnancy, antepartum; Ulcerative colitis ([place]; History of pre-eclampsia in prior pregnancy, currently pregnant; Chronic back pain; History of depression; Hx of migraine headaches; Fetal 2cm subdiaphragmatic mass on ultrasound; Obesity in pregnancy; BMI 45.0-49.9, adult ([place]; Chronic prescription opiate use; Late prenatal care; History of preterm delivery; and Abnormal glucose affecting pregnancy on their problem list.  Patient reports worsening back pain.  Contractions: Irregular. Vag. Bleeding: None.  Movement: Present. Denies leaking of fluid.   The following portions of the patient's history were reviewed and updated as appropriate: allergies, current medications, past family history, past medical history, past social history, past surgical history and problem list.   Objective:   Vitals:   04/06/19 1343 04/06/19 1354  BP: (!) 152/82 115/63  Pulse: (!) 124   Weight: 288 lb (130.6 kg)     Fetal Status: Fetal Heart Rate (bpm): 137   Movement: Present     General:  Alert, oriented and cooperative. Patient is in no acute distress.  Skin: Skin is warm and dry. No rash noted.   Cardiovascular: Normal heart rate noted  Respiratory: Normal respiratory effort, no problems with respiration noted  Abdomen: Soft, gravid, appropriate for gestational age.  Pain/Pressure: Present     Pelvic: Cervical exam performed        Extremities: Normal range of motion.  Edema: None  Mental Status: Normal mood and affect. Normal behavior. Normal judgment and thought content.   Assessment and Plan:  Pregnancy: [id number] 333w5d. Supervision of high risk pregnancy, antepartum Patient is doing well She reports no improvement in pain with flexeril. Patient states she  placed herself on bedrest due to her back pain Encouraged patient to perform back exercises daily Postdate testing next week and patient scheduled for IOL  2. Abnormal glucose affecting pregnancy Patient not checking CBGs regularly but reports fasting within range  Term labor symptoms and general obstetric precautions including but not limited to vaginal bleeding, contractions, leaking of fluid and fetal movement were reviewed in detail with the patient. Please refer to After Visit Summary for other counseling recommendations.   Return in about 5 days (around 04/11/2019) for ROB, NST,, AFI.  No future appointments.  PeMora BellmanMD

## 2019-04-06 NOTE — Telephone Encounter (Signed)
Called the patient to confirm appointment and inform of new address. The patient verbalized understanding.

## 2019-04-06 NOTE — Progress Notes (Signed)
Patient has elevated phq9 & gad7- Dr Constant aware.

## 2019-04-07 ENCOUNTER — Telehealth: Payer: Self-pay | Admitting: Obstetrics and Gynecology

## 2019-04-07 NOTE — Telephone Encounter (Signed)
Called the patient to confirm the appointment, left a detailed voicemail message with our new location and appointment time. °

## 2019-04-10 ENCOUNTER — Ambulatory Visit (INDEPENDENT_AMBULATORY_CARE_PROVIDER_SITE_OTHER): Payer: Medicaid Other | Admitting: Obstetrics & Gynecology

## 2019-04-10 ENCOUNTER — Other Ambulatory Visit (HOSPITAL_COMMUNITY)
Admission: RE | Admit: 2019-04-10 | Discharge: 2019-04-10 | Disposition: A | Discharge status: Home / Self Care | Payer: Medicaid Other | Source: Ambulatory Visit | Attending: Obstetrics & Gynecology | Admitting: Obstetrics & Gynecology

## 2019-04-10 ENCOUNTER — Other Ambulatory Visit: Payer: Self-pay

## 2019-04-10 ENCOUNTER — Encounter (HOSPITAL_COMMUNITY): Payer: Self-pay | Admitting: *Deleted

## 2019-04-10 ENCOUNTER — Ambulatory Visit: Payer: Self-pay

## 2019-04-10 ENCOUNTER — Telehealth (HOSPITAL_COMMUNITY): Payer: Self-pay | Admitting: *Deleted

## 2019-04-10 ENCOUNTER — Ambulatory Visit (INDEPENDENT_AMBULATORY_CARE_PROVIDER_SITE_OTHER): Payer: Medicaid Other | Admitting: General Practice

## 2019-04-10 VITALS — BP 128/67 | HR 94 | Wt 288.0 lb

## 2019-04-10 DIAGNOSIS — Z3A40 40 weeks gestation of pregnancy: Secondary | ICD-10-CM | POA: Diagnosis not present

## 2019-04-10 DIAGNOSIS — O480 Post-term pregnancy: Secondary | ICD-10-CM

## 2019-04-10 DIAGNOSIS — O2600 Excessive weight gain in pregnancy, unspecified trimester: Secondary | ICD-10-CM | POA: Insufficient documentation

## 2019-04-10 DIAGNOSIS — O2603 Excessive weight gain in pregnancy, third trimester: Secondary | ICD-10-CM

## 2019-04-10 DIAGNOSIS — O99213 Obesity complicating pregnancy, third trimester: Secondary | ICD-10-CM

## 2019-04-10 DIAGNOSIS — O99810 Abnormal glucose complicating pregnancy: Secondary | ICD-10-CM | POA: Diagnosis not present

## 2019-04-10 DIAGNOSIS — O0993 Supervision of high risk pregnancy, unspecified, third trimester: Secondary | ICD-10-CM | POA: Diagnosis present

## 2019-04-10 DIAGNOSIS — O09299 Supervision of pregnancy with other poor reproductive or obstetric history, unspecified trimester: Secondary | ICD-10-CM

## 2019-04-10 DIAGNOSIS — O99210 Obesity complicating pregnancy, unspecified trimester: Secondary | ICD-10-CM

## 2019-04-10 DIAGNOSIS — O09293 Supervision of pregnancy with other poor reproductive or obstetric history, third trimester: Secondary | ICD-10-CM | POA: Diagnosis not present

## 2019-04-10 DIAGNOSIS — O0990 Supervision of high risk pregnancy, unspecified, unspecified trimester: Secondary | ICD-10-CM | POA: Diagnosis present

## 2019-04-10 NOTE — Telephone Encounter (Signed)
Preadmission screen  

## 2019-04-10 NOTE — Progress Notes (Signed)
Pt informed that the ultrasound is considered a limited OB ultrasound and is not intended to be a complete ultrasound exam.  Patient also informed that the ultrasound is not being completed with the intent of assessing for fetal or placental anomalies or any pelvic abnormalities.  Explained that the purpose of today's ultrasound is to assess for  BPP, presentation and AFI.  Patient acknowledges the purpose of the exam and the limitations of the study.    Koren Bound RN BSN 04/10/19

## 2019-04-10 NOTE — Progress Notes (Signed)
   PRENATAL VISIT NOTE  Subjective:  Vanessa Nolan is a 33 y.o. [id number] at 7w2dbeing seen today for ongoing prenatal care.  She is currently monitored for the following issues for this high-risk pregnancy and has Supervision of high risk pregnancy, antepartum; Ulcerative colitis ([place]; History of pre-eclampsia in prior pregnancy, currently pregnant; Chronic back pain; History of depression; Hx of migraine headaches; Fetal 2cm subdiaphragmatic mass on ultrasound; Obesity in pregnancy; BMI 45.0-49.9, adult ([place]; Chronic prescription opiate use; Late prenatal care; History of preterm delivery; Abnormal glucose affecting pregnancy; and Excessive weight gain in pregnancy on their problem list.  Patient reports no complaints.  Contractions: Irregular. Vag. Bleeding: None.  Movement: Present. Denies leaking of fluid.   The following portions of the patient's history were reviewed and updated as appropriate: allergies, current medications, past family history, past medical history, past social history, past surgical history and problem list.   Objective:   Vitals:   04/10/19 0946 04/10/19 1046  BP: (!) 144/78 128/67  Pulse: 94   Weight: 288 lb (130.6 kg)     Fetal Status: Fetal Heart Rate (bpm): NST   Movement: Present     General:  Alert, oriented and cooperative. Patient is in no acute distress.  Skin: Skin is warm and dry. No rash noted.   Cardiovascular: Normal heart rate noted  Respiratory: Normal respiratory effort, no problems with respiration noted  Abdomen: Soft, gravid, appropriate for gestational age.  Pain/Pressure: Present     Pelvic: Cervical exam performed        Extremities: Normal range of motion.  Edema: None  Mental Status: Normal mood and affect. Normal behavior. Normal judgment and thought content.   Assessment and Plan:  Pregnancy: [id number] 421w2d. Supervision of high risk pregnancy, antepartum - IOL on this Saturday - Culture, beta strep (group b only) -  GC/Chlamydia [facility]. History of pre-eclampsia in prior pregnancy, currently pregnant   3. Obesity in pregnancy   4. Abnormal glucose affecting pregnancy - she says that she has not checked her sugars in "a couple of weeks" - Hemoglobin A1c  5. Excessive weight gain during pregnancy, antepartum   Term labor symptoms and general obstetric precautions including but not limited to vaginal bleeding, contractions, leaking of fluid and fetal movement were reviewed in detail with the patient. Please refer to After Visit Summary for other counseling recommendations.   Return in about 6 weeks (around 05/22/2019) for pp visit with 2 hour GTT.  Future Appointments  Date Time Provider [place]/13/2020  8:30 AM [facility]-MAU 1 [facility]-INDC None  04/15/2019  7:30 AM MC-LD SCHED ROOM [facility]-IN[facility] None    MyEmily FilbertMD

## 2019-04-11 LAB — HEMOGLOBIN A1C
Est. average glucose Bld gHb Est-mCnc: 103 mg/dL
Hgb A1c MFr Bld: 5.2 % (ref 4.8–5.6)

## 2019-04-11 LAB — GC/CHLAMYDIA PROBE AMP (~~LOC~~) NOT AT ARMC
Chlamydia: NEGATIVE
Neisseria Gonorrhea: NEGATIVE

## 2019-04-12 ENCOUNTER — Other Ambulatory Visit: Payer: Self-pay | Admitting: Advanced Practice Midwife

## 2019-04-12 ENCOUNTER — Other Ambulatory Visit (HOSPITAL_COMMUNITY)
Admission: RE | Admit: 2019-04-12 | Discharge: 2019-04-12 | Disposition: A | Discharge status: Home / Self Care | Payer: Medicaid Other | Source: Ambulatory Visit | Attending: Obstetrics & Gynecology | Admitting: Obstetrics & Gynecology

## 2019-04-12 ENCOUNTER — Other Ambulatory Visit: Payer: Medicaid Other

## 2019-04-14 ENCOUNTER — Other Ambulatory Visit (HOSPITAL_COMMUNITY): Payer: Self-pay | Admitting: *Deleted

## 2019-04-14 LAB — CULTURE, BETA STREP (GROUP B ONLY): Strep Gp B Culture: NEGATIVE

## 2019-04-15 ENCOUNTER — Inpatient Hospital Stay (HOSPITAL_COMMUNITY): Payer: Medicaid Other

## 2019-04-15 ENCOUNTER — Other Ambulatory Visit: Payer: Self-pay

## 2019-04-15 ENCOUNTER — Inpatient Hospital Stay (HOSPITAL_COMMUNITY)
Admission: AD | Admit: 2019-04-15 | Discharge: 2019-04-18 | DRG: 806 | Disposition: A | Discharge status: Home / Self Care | Payer: Medicaid Other | Attending: Obstetrics and Gynecology | Admitting: Obstetrics and Gynecology

## 2019-04-15 ENCOUNTER — Encounter (HOSPITAL_COMMUNITY): Payer: Self-pay | Admitting: General Practice

## 2019-04-15 DIAGNOSIS — O99214 Obesity complicating childbirth: Secondary | ICD-10-CM | POA: Diagnosis present

## 2019-04-15 DIAGNOSIS — O99810 Abnormal glucose complicating pregnancy: Secondary | ICD-10-CM | POA: Diagnosis present

## 2019-04-15 DIAGNOSIS — O0990 Supervision of high risk pregnancy, unspecified, unspecified trimester: Secondary | ICD-10-CM

## 2019-04-15 DIAGNOSIS — O99354 Diseases of the nervous system complicating childbirth: Secondary | ICD-10-CM | POA: Diagnosis present

## 2019-04-15 DIAGNOSIS — O480 Post-term pregnancy: Principal | ICD-10-CM | POA: Diagnosis present

## 2019-04-15 DIAGNOSIS — O9962 Diseases of the digestive system complicating childbirth: Secondary | ICD-10-CM | POA: Diagnosis present

## 2019-04-15 DIAGNOSIS — Z79891 Long term (current) use of opiate analgesic: Secondary | ICD-10-CM | POA: Diagnosis not present

## 2019-04-15 DIAGNOSIS — Z3A41 41 weeks gestation of pregnancy: Secondary | ICD-10-CM | POA: Diagnosis not present

## 2019-04-15 DIAGNOSIS — Z8659 Personal history of other mental and behavioral disorders: Secondary | ICD-10-CM

## 2019-04-15 DIAGNOSIS — Z1159 Encounter for screening for other viral diseases: Secondary | ICD-10-CM

## 2019-04-15 DIAGNOSIS — K5190 Ulcerative colitis, unspecified, without complications: Secondary | ICD-10-CM | POA: Diagnosis present

## 2019-04-15 DIAGNOSIS — O2600 Excessive weight gain in pregnancy, unspecified trimester: Secondary | ICD-10-CM

## 2019-04-15 DIAGNOSIS — O0930 Supervision of pregnancy with insufficient antenatal care, unspecified trimester: Secondary | ICD-10-CM

## 2019-04-15 DIAGNOSIS — Z6841 Body Mass Index (BMI) 40.0 and over, adult: Secondary | ICD-10-CM

## 2019-04-15 DIAGNOSIS — G8929 Other chronic pain: Secondary | ICD-10-CM | POA: Diagnosis present

## 2019-04-15 DIAGNOSIS — O283 Abnormal ultrasonic finding on antenatal screening of mother: Secondary | ICD-10-CM | POA: Diagnosis present

## 2019-04-15 DIAGNOSIS — M545 Low back pain: Secondary | ICD-10-CM | POA: Diagnosis present

## 2019-04-15 DIAGNOSIS — O09299 Supervision of pregnancy with other poor reproductive or obstetric history, unspecified trimester: Secondary | ICD-10-CM

## 2019-04-15 LAB — CBC
HCT: 36.3 % (ref 36.0–46.0)
Hemoglobin: 11.5 g/dL — ABNORMAL LOW (ref 12.0–15.0)
MCH: 25.8 pg — ABNORMAL LOW (ref 26.0–34.0)
MCHC: 31.7 g/dL (ref 30.0–36.0)
MCV: 81.4 fL (ref 80.0–100.0)
Platelets: 292 10*3/uL (ref 150–400)
RBC: 4.46 MIL/uL (ref 3.87–5.11)
RDW: 14.6 % (ref 11.5–15.5)
WBC: 9.6 10*3/uL (ref 4.0–10.5)
nRBC: 0 % (ref 0.0–0.2)

## 2019-04-15 LAB — TYPE AND SCREEN
ABO/RH(D): A POS
Antibody Screen: NEGATIVE

## 2019-04-15 LAB — ABO/RH: ABO/RH(D): A POS

## 2019-04-15 LAB — SARS CORONAVIRUS 2 BY RT PCR (HOSPITAL ORDER, PERFORMED IN ~~LOC~~ HOSPITAL LAB): SARS Coronavirus 2: NEGATIVE

## 2019-04-15 LAB — RPR: RPR Ser Ql: NONREACTIVE

## 2019-04-15 MED ORDER — FENTANYL CITRATE (PF) 100 MCG/2ML IJ SOLN
100.0000 ug | INTRAMUSCULAR | Status: DC | PRN
  Administered 2019-04-15 – 2019-04-16 (×3): 100 ug via INTRAVENOUS
  Filled 2019-04-15 (×3): qty 2

## 2019-04-15 MED ORDER — LIDOCAINE HCL (PF) 1 % IJ SOLN: 30.0000 mL | INTRAMUSCULAR | Status: DC | PRN

## 2019-04-15 MED ORDER — ACETAMINOPHEN 500 MG PO TABS
1000.0000 mg | ORAL_TABLET | Freq: Once | ORAL | Status: AC
  Administered 2019-04-15: 16:00:00 1000 mg via ORAL
  Filled 2019-04-15: qty 2

## 2019-04-15 MED ORDER — LACTATED RINGERS IV SOLN: 500.0000 mL | INTRAVENOUS | Status: DC | PRN

## 2019-04-15 MED ORDER — CYCLOBENZAPRINE HCL 5 MG PO TABS
10.0000 mg | ORAL_TABLET | Freq: Once | ORAL | Status: AC
  Administered 2019-04-15: 16:00:00 10 mg via ORAL
  Filled 2019-04-15: qty 2

## 2019-04-15 MED ORDER — TERBUTALINE SULFATE 1 MG/ML IJ SOLN: 0.2500 mg | Freq: Once | INTRAMUSCULAR | Status: DC | PRN

## 2019-04-15 MED ORDER — OXYTOCIN 40 UNITS IN NORMAL SALINE INFUSION - SIMPLE MED: 2.5000 [IU]/h | INTRAVENOUS | Status: DC

## 2019-04-15 MED ORDER — MISOPROSTOL 50MCG HALF TABLET
50.0000 ug | ORAL_TABLET | ORAL | Status: DC
  Administered 2019-04-15: 09:00:00 50 ug via BUCCAL
  Filled 2019-04-15: qty 1

## 2019-04-15 MED ORDER — OXYTOCIN BOLUS FROM INFUSION
500.0000 mL | Freq: Once | INTRAVENOUS | Status: AC
  Administered 2019-04-16: 01:00:00 500 mL via INTRAVENOUS

## 2019-04-15 MED ORDER — LACTATED RINGERS IV SOLN
INTRAVENOUS | Status: DC
  Administered 2019-04-15 (×3): via INTRAVENOUS

## 2019-04-15 MED ORDER — SOD CITRATE-CITRIC ACID 500-334 MG/5ML PO SOLN
30.0000 mL | ORAL | Status: DC | PRN
  Administered 2019-04-15: 23:00:00 30 mL via ORAL
  Filled 2019-04-15: qty 30

## 2019-04-15 MED ORDER — ACETAMINOPHEN 325 MG PO TABS: 650.0000 mg | ORAL_TABLET | ORAL | Status: DC | PRN

## 2019-04-15 MED ORDER — ONDANSETRON HCL 4 MG/2ML IJ SOLN
4.0000 mg | Freq: Four times a day (QID) | INTRAMUSCULAR | Status: DC | PRN
  Administered 2019-04-15: 4 mg via INTRAVENOUS
  Filled 2019-04-15: qty 2

## 2019-04-15 MED ORDER — FENTANYL CITRATE (PF) 100 MCG/2ML IJ SOLN
50.0000 ug | Freq: Once | INTRAMUSCULAR | Status: AC
  Administered 2019-04-15: 12:00:00 50 ug via INTRAVENOUS
  Filled 2019-04-15: qty 2

## 2019-04-15 MED ORDER — OXYTOCIN 40 UNITS IN NORMAL SALINE INFUSION - SIMPLE MED
1.0000 m[IU]/min | INTRAVENOUS | Status: DC
  Administered 2019-04-15: 13:00:00 2 m[IU]/min via INTRAVENOUS
  Filled 2019-04-15: qty 1000

## 2019-04-15 NOTE — Progress Notes (Signed)
OB/GYN Faculty Practice: Labor Progress Note  Subjective: Doing well, more intense contractions over last hour and also having back labor. Getting up to use bathroom frequently.    Objective: BP 124/83   Pulse 98   Temp 98.3 F (36.8 C) (Oral)   Resp 20   Ht 5\' 6"  (1.676 m)   Wt 130.4 kg   LMP 07/16/2018 (Exact Date) Comment: 4 months pregnant, lead shielding provided//a.c.  BMI 46.39 kg/m  Gen: mildly uncomfortable appearing Dilation: 6 Effacement (%): 50 Cervical Position: Posterior Station: -2 Presentation: Vertex Exam by:: Dr. Earlene Plater  Assessment and Plan: 33 y.o. [id number] [redacted]w[redacted]d here for IOL for post-dates.   Labor: PDIOL started with cytotec this morning followed by pitocin. Titrating pitocin per protocol but cervix still posterior and somewhat thick. Discussed option for AROM given discomfort and preference for unmediated delivery, reviewed risks/benefits. Patient in agreement, AROM with moderate meconium. FSE placed given difficult to trace fetus with patient being active.  -- pain control: wants to try IV fentanyl, would not like epidural given history of back problems  -- PPH Risk: medium   Fetal Well-Being: EFW 59% at 33w4. Cephalic by sutures.  -- Category I - continuous fetal monitoring, FSE now in place as above  -- GBS negative    Manvi Guilliams S. Earlene Plater, DO OB/GYN Fellow, Faculty Practice  10:15 PM

## 2019-04-15 NOTE — Consult Note (Signed)
Neonatology Consult to Antenatal Patient:  I was asked by Mallie Snooks, CNM for Dr. Rip Harbour to see this patient in order to provide antenatal counseling due to abnormal finding on fetal ultrasound.  Vanessa Nolan was admitted today at [redacted] weeks GA for IOL due to post-dates. She has had late [facility], but was seen by MFM, and has had ultrasounds showing that the baby has a left-sided sub-diaphragmatic mass of about 2 cm on the left near the stomach; the diaphragm has appeared intact on both exams. Dr. Donalee Citrin felt this mass might be a small pulmonary sequestration. Infant has had no signs of hydrops and no other malformations, no distress. The mother's history is also remarkable for chronic opiate use, currently on Tramadol and Tylenol #4. She plans to breast feed and is aware that the baby might have withdrawal.  I spoke with the patient and the baby's father. I let them know that a neonatal resuscitation  team will be present at delivery to assess the baby in case of any respiratory distress. If the baby appears normal, will remain on MBU status and can have postnatal ultrasound to ascertain what the mass actually is. In the worst case scenario, if the baby is distressed at birth and requires support, the baby will go to the NICU for more immediate work-up. Prognosis and interventions are pending exact diagnosis.  Thank you for asking me to see this patient.  Real Cons, MD Neonatologist  The total length of face-to-face or floor/unit time for this encounter was 25 minutes. Counseling and/or coordination of care was 15 minutes of the above.

## 2019-04-15 NOTE — Progress Notes (Addendum)
Vanessa Nolan is a 33 y.o. [id number] at 30w0dadmitted for PDIOL  Subjective: Patient reports pain in back, resolved with repositioning and IV Fentanyl x 1. Patient talking, laughing, repositioning self in bed without difficulty despite back pain.  Objective: BP 114/64   Pulse 86   Temp 98.5 F (36.9 C) (Oral)   Resp 16   Ht 5' 6"  (1.676 m)   Wt 130.4 kg   LMP 07/16/2018 (Exact Date) Comment: 4 months pregnant, lead shielding provided//a.c.  BMI 46.39 kg/m  No intake/output data recorded. No intake/output data recorded.  FHT:  FHR: 140 bpm, variability: moderate,  accelerations:  Present,  decelerations:  Absent UC:   Occasional SVE:   Dilation: 3 Effacement (%): 50 Station: -3 Exam by:: S [employer] Lab Results  Component Value Date   WBC 9.6 04/15/2019   HGB 11.5 (L) 04/15/2019   HCT 36.3 04/15/2019   MCV 81.4 04/15/2019   PLT 292 04/15/2019    Assessment / Plan: 33y.o. [id number] 439w0dCategory I tracing S/p Cytotec # 1 at 0855 Cervix noticeably softer and more anterior Start Pitocin 2 x 2 now Anticipate NSVD  NICU at bedside for consult  SaDarlina RumpfCNPinewood/16/2020, 12:57 PM

## 2019-04-15 NOTE — Progress Notes (Signed)
Smt Stidam is a 33 y.o. H8I6962 at [redacted]w[redacted]d admitted for PDIOL  Subjective: Patient continues to cope well with low back pain. Denies strong contraction-related pain. Verbalizes feeling frustrated about induction process and time required to achieve active labor.  Objective: BP 129/71   Pulse 88   Temp 98.3 F (36.8 C) (Oral)   Resp 16   Ht 5\' 6"  (1.676 m)   Wt 130.4 kg   LMP 07/16/2018 (Exact Date) Comment: 4 months pregnant, lead shielding provided//a.c.  BMI 46.39 kg/m  No intake/output data recorded. No intake/output data recorded.  FHT:  FHR: 135 bpm, variability: moderate,  accelerations:  Present,  decelerations:  Absent UC:   irregular, every 1-5 minutes SVE:   Dilation: 4.5 Effacement (%): 60 Station: -3 Exam by:: S [employer]: Lab Results  Component Value Date   WBC 9.6 04/15/2019   HGB 11.5 (L) 04/15/2019   HCT 36.3 04/15/2019   MCV 81.4 04/15/2019   PLT 292 04/15/2019    Assessment / Plan: Category I tracing S/p Cytotec at 0855 Pitocin 2 x 2 infusing since 1302. Continue to titrate Reassured patient that labor course thus far is normal, safe, and appropriate Patient planning unmedicated labor Anticipate NSVD  Calvert Cantor, CNM 04/15/2019, [phone]

## 2019-04-15 NOTE — H&P (Signed)
Vanessa Nolan is a 33 y.o. female presenting for PDIOL. Her dating is based on 29 wk [place]. She receives prenatal care at [facility]. At admission she denies abdominal pain, vaginal bleeding, leaking of fluid, decreased fetal movement, fever, falls, or recent illness.    Her pregnancy is complicated by the following:  Patient Active Problem List   Diagnosis Date Noted  . Post term pregnancy at [redacted] weeks gestation 04/15/2019  . Excessive weight gain in pregnancy 04/10/2019  . Abnormal glucose affecting pregnancy 02/13/2019  . Obesity in pregnancy 02/06/2019  . BMI 45.0-49.9, adult (HCC) 02/06/2019  . Chronic prescription opiate use 02/06/2019  . Late prenatal care 02/06/2019  . History of preterm delivery 02/06/2019  . Fetal 2cm subdiaphragmatic mass on ultrasound 01/25/2019  . Supervision of high risk pregnancy, antepartum 01/20/2019  . Ulcerative colitis (HCC) 01/20/2019  . History of pre-eclampsia in prior pregnancy, currently pregnant 01/20/2019  . Chronic back pain 01/20/2019  . History of depression 01/20/2019  . Hx of migraine headaches 01/20/2019   Fetal Ultrasound --Echogenic subdiaphragmatic mass, close to stomach on left side --Possibly extralobular pulmonary sequestration per MFM --Patient did not complete NICU consult during her prenatal care --EFW 2235g/59% at 33w 4d  OB History    Gravida  6   Para  4   Term  3   Preterm  1   AB  1   Living  4     SAB  1   TAB      Ectopic      Multiple      Live Births  4          Past Medical History:  Diagnosis Date  . Asthma   . Depression   . Headache    Migraines  . Herniated disc, cervical   . Pregnancy induced hypertension 2015  . Preterm labor   . Ulcerative colitis [facility])    Past Surgical History:  Procedure Laterality Date  . CHOLECYSTECTOMY    . COLONOSCOPY    . KNEE SURGERY    . WISDOM TOOTH EXTRACTION     Family History: family history includes Cancer in her brother and paternal  grandfather; Diabetes in her father. Social History:  reports that she has never smoked. She has never used smokeless tobacco. She reports previous alcohol use. She reports that she does not use drugs.     Maternal Diabetes: No Normal 1 hour, normal HgA1C Genetic Screening: Normal Maternal Ultrasounds/Referrals: Abnormal:  Findings:   Other: subdiaphragmatic mass Fetal Ultrasounds or other Referrals:  Referred to Materal Fetal Medicine  Maternal Substance Abuse:  Yes:  Type: Other: Opiates Significant Maternal Medications:  Meds include: Other:  Tramadol for Chronic back pain Significant Maternal Lab Results:  None Other Comments:  None  Review of Systems  Constitutional: Negative for fever.  Respiratory: Negative for cough and shortness of breath.   Gastrointestinal: Negative for abdominal pain, nausea and vomiting.  Musculoskeletal: Positive for back pain.       Patient suffers from chronic back pain   All other systems reviewed and are negative.   Dilation: 3 Effacement (%): 50 Station: Ballotable Exam by:: S Tkeya Stencil Blood pressure 95/65, pulse (!) 114, temperature 97.6 F (36.4 C), temperature source Oral, resp. rate 16, height 5\' 6"  (1.676 m), weight 130.4 kg, last menstrual period 07/16/2018. Physical Exam  Nursing note and vitals reviewed. Constitutional: She is oriented to person, place, and time. She appears well-developed and well-nourished.  Cardiovascular: Normal rate.  Respiratory:  Effort normal and breath sounds normal. No respiratory distress.  GI: She exhibits no distension. There is no abdominal tenderness. There is no rebound and no guarding.  Gravid  Neurological: She is alert and oriented to person, place, and time.  Skin: Skin is warm and dry.  Psychiatric: She has a normal mood and affect. Her behavior is normal. Judgment and thought content normal.   Fetal Well-Being --Category I tracing: baseline 140, moderate variability, positive accels, no  decels --Toco: rare, palpation compromised by maternal habitus  Prenatal labs: ABO, Rh: --/--/PENDING (05/16 0818) Antibody: PENDING (05/16 0818) Rubella: 1.47 (02/21 1151) RPR: Non Reactive (02/21 1151)  HBsAg: Negative (02/21 1151)  HIV: Non Reactive (02/21 1151)  GBS:   NEGATIVE  Assessment/Plan: --33 y.o. L2G4010G6P3114 at 7494w0d  --Category I tracing --GBS NEGATIVE --Dr. Joana ReameraVanzo, Neonatalogist notified of patient admission. Will round on patient today --Cervix remains 3cm/thick and long/posterior. Placed buccal Cytotec at 0855 --Vertex confirmed via bedside ultrasound --Epidural in active labor  Gender unknown/breast/partner vasectomy or BTL in event of cesarean  Calvert CantorSamantha C Dashton Czerwinski, CNM 04/15/2019, 10:27 AM

## 2019-04-16 ENCOUNTER — Encounter (HOSPITAL_COMMUNITY): Payer: Self-pay | Admitting: *Deleted

## 2019-04-16 DIAGNOSIS — O480 Post-term pregnancy: Secondary | ICD-10-CM

## 2019-04-16 DIAGNOSIS — Z3A41 41 weeks gestation of pregnancy: Secondary | ICD-10-CM

## 2019-04-16 LAB — NOVEL CORONAVIRUS, NAA (HOSP ORDER, SEND-OUT TO REF LAB; TAT 18-24 HRS): SARS-CoV-2, NAA: NOT DETECTED

## 2019-04-16 MED ORDER — TETANUS-DIPHTH-ACELL PERTUSSIS 5-2.5-18.5 LF-MCG/0.5 IM SUSP: 0.5000 mL | Freq: Once | INTRAMUSCULAR | Status: DC

## 2019-04-16 MED ORDER — IBUPROFEN 600 MG PO TABS
600.0000 mg | ORAL_TABLET | Freq: Four times a day (QID) | ORAL | Status: DC
  Administered 2019-04-16 – 2019-04-18 (×10): 600 mg via ORAL
  Filled 2019-04-16 (×11): qty 1

## 2019-04-16 MED ORDER — DIBUCAINE (PERIANAL) 1 % EX OINT: 1.0000 "application " | TOPICAL_OINTMENT | CUTANEOUS | Status: DC | PRN

## 2019-04-16 MED ORDER — OXYCODONE HCL 5 MG PO TABS
5.0000 mg | ORAL_TABLET | ORAL | Status: DC | PRN
  Administered 2019-04-16: 02:00:00 5 mg via ORAL
  Filled 2019-04-16: qty 1

## 2019-04-16 MED ORDER — WITCH HAZEL-GLYCERIN EX PADS: 1.0000 "application " | MEDICATED_PAD | CUTANEOUS | Status: DC | PRN

## 2019-04-16 MED ORDER — ONDANSETRON HCL 4 MG/2ML IJ SOLN: 4.0000 mg | INTRAMUSCULAR | Status: DC | PRN

## 2019-04-16 MED ORDER — OXYCODONE HCL 5 MG PO TABS
10.0000 mg | ORAL_TABLET | ORAL | Status: DC | PRN
  Administered 2019-04-16 – 2019-04-18 (×13): 10 mg via ORAL
  Filled 2019-04-16 (×13): qty 2

## 2019-04-16 MED ORDER — SENNOSIDES-DOCUSATE SODIUM 8.6-50 MG PO TABS
2.0000 | ORAL_TABLET | ORAL | Status: DC
  Administered 2019-04-16: 2 via ORAL
  Filled 2019-04-16 (×2): qty 2

## 2019-04-16 MED ORDER — BENZOCAINE-MENTHOL 20-0.5 % EX AERO: 1.0000 "application " | INHALATION_SPRAY | CUTANEOUS | Status: DC | PRN

## 2019-04-16 MED ORDER — MEASLES, MUMPS & RUBELLA VAC IJ SOLR: 0.5000 mL | Freq: Once | INTRAMUSCULAR | Status: DC

## 2019-04-16 MED ORDER — PRENATAL MULTIVITAMIN CH
1.0000 | ORAL_TABLET | Freq: Every day | ORAL | Status: DC
  Administered 2019-04-16 – 2019-04-18 (×3): 1 via ORAL
  Filled 2019-04-16 (×3): qty 1

## 2019-04-16 MED ORDER — ACETAMINOPHEN 325 MG PO TABS
650.0000 mg | ORAL_TABLET | ORAL | Status: DC | PRN
  Administered 2019-04-16: 650 mg via ORAL
  Filled 2019-04-16 (×2): qty 2

## 2019-04-16 MED ORDER — COCONUT OIL OIL: 1.0000 "application " | TOPICAL_OIL | Status: DC | PRN

## 2019-04-16 MED ORDER — ZOLPIDEM TARTRATE 5 MG PO TABS: 5.0000 mg | ORAL_TABLET | Freq: Every evening | ORAL | Status: DC | PRN

## 2019-04-16 MED ORDER — ONDANSETRON HCL 4 MG PO TABS: 4.0000 mg | ORAL_TABLET | ORAL | Status: DC | PRN

## 2019-04-16 MED ORDER — DIPHENHYDRAMINE HCL 25 MG PO CAPS: 25.0000 mg | ORAL_CAPSULE | Freq: Four times a day (QID) | ORAL | Status: DC | PRN

## 2019-04-16 MED ORDER — SIMETHICONE 80 MG PO CHEW: 80.0000 mg | CHEWABLE_TABLET | ORAL | Status: DC | PRN

## 2019-04-16 NOTE — Discharge Summary (Addendum)
Obstetrics Discharge Summary OB/GYN Faculty Practice   Patient Name: Vanessa Nolan DOB: [birth year] MRN: [id number]  Date of admission: 04/15/2019 Delivering MD: Glenice Bow   Date of discharge: 04/18/2019  Admitting diagnosis: pregnancy Intrauterine pregnancy: [redacted]w[redacted]d    Secondary diagnosis:   Principal Problem:   Post term pregnancy at 439weeks gestation Active Problems:   Ulcerative colitis ([place]   History of pre-eclampsia in prior pregnancy, currently pregnant   Chronic back pain   History of depression   Fetal 2cm subdiaphragmatic mass on ultrasound   BMI 45.0-49.9, adult ([place]   Chronic prescription opiate use   Late prenatal care   Abnormal glucose affecting pregnancy   Excessive weight gain in pregnancy    Discharge diagnosis: Term Pregnancy Delivered                              Postpartum procedures: None  Complications: None  Outpatient Follow-Up: _0  ensure plan for contraception - planning vasectomy. Will use POPs until Vasectomy effectiveness verified   Hospital course: Vanessa Nolan a 33y.o. 448w1dho was admitted for post-dates induction of labor. Her pregnancy was complicated by above noted items. Her labor course was notable for induction with cytotec followed by pitocin then AROM. Labile BP's noted. Delivery was complicated by hemostatic 1st degree perineal laceration and NICU attendance given infant diaphragmatic mass. Please see delivery/op note for additional details. Her postpartum course was uncomplicated. She was breastfeeding without difficulty. By day of discharge, she was passing flatus, urinating, eating and drinking without difficulty. Her acute pain was well-controlled. She had chronic back and hip pain unchanged from baseline [place] and is looking for a PCP. Has Ultram for pain. Encouraged pt to get into PT. Discouraged long-term ultram use. Discussed max dose 100 mg. No benefit,  possible danger to exceeding that dose. She was discharged home with HCTZ x 7 days, Motrin and Tylenol #3 #10 tabs. She will follow-up in clinic in 3 days for BP check and 6 weeks for PP visit and 2 hour GTT. Will increase Lialda to 2.4 mg BID per GI doctor.   Physical exam  Vitals:   04/16/19 2206 04/17/19 1826 04/17/19 2337 04/18/19 0524  BP: 113/73 129/83 127/86 (!) 149/95  Pulse: 70 85 86 81  Resp: _1 Temp:  98 F (36.7 C) 98.1 F (36.7 C) 98.1 F (36.7 C)  TempSrc: Oral  Oral Oral  SpO2: 100% 99%    Weight:      Height:       General: NA. Obese, no pallor Lochia: appropriate Uterine Fundus: firm Incision: N/A DVT Evaluation: No evidence of DVT seen on physical exam. Labs: Lab Results  Component Value Date   WBC 9.6 04/15/2019   HGB 11.5 (L) 04/15/2019   HCT 36.3 04/15/2019   MCV 81.4 04/15/2019   PLT 292 04/15/2019   CMP Latest Ref Rng & Units 01/20/2019  Glucose 65 - 99 mg/dL 113(H)  BUN 6 - 20 mg/dL 9  Creatinine 0.57 - 1.00 mg/dL 0.65  Sodium 134 - 144 mmol/L 137  Potassium 3.5 - 5.2 mmol/L 3.8  Chloride 96 - 106 mmol/L 103  CO2 20 - 29 mmol/L 19(L)  Calcium 8.7 - 10.2 mg/dL 9.0  Total Protein 6.0 - 8.5 g/dL 6.5  Total Bilirubin 0.0 - 1.2 mg/dL <0.2  Alkaline Phos  39 - 117 IU/L 86  AST 0 - 40 IU/L 10  ALT 0 - 32 IU/L 11    Discharge instructions: Per After Visit Summary and "Baby and Me Booklet"  After visit meds:  Allergies as of 04/18/2019      Reactions   Morphine And Related Other (See Comments)   tachycardia      Medication List    STOP taking these medications   Accu-Chek FastClix Lancets Misc   Accu-Chek Guide w/Device Kit   glucose blood test strip Commonly known as:  Accu-Chek Guide     TAKE these medications   acetaminophen 500 MG tablet Commonly known as:  TYLENOL Take 1,000 mg by mouth every 6 (six) hours as needed for mild pain or headache.   acetaminophen-codeine 300-30 MG tablet Commonly known as:  TYLENOL #3 Take  2 tablets by mouth at bedtime as needed for severe pain. Do not take within 6 hours of Tramadol What changed:    additional instructions  Another medication with the same name was removed. Continue taking this medication, and follow the directions you see here.   albuterol 108 (90 Base) MCG/ACT inhaler Commonly known as:  VENTOLIN HFA Inhale 2 puffs into the lungs every 6 (six) hours as needed for wheezing or shortness of breath.   cyclobenzaprine 10 MG tablet Commonly known as:  FLEXERIL Take 1 tablet (10 mg total) by mouth every 8 (eight) hours as needed for muscle spasms.   hydrochlorothiazide 25 MG tablet Commonly known as:  HYDRODIURIL Take 1 tablet (25 mg total) by mouth daily.   ibuprofen 600 MG tablet Commonly known as:  ADVIL Take 1 tablet (600 mg total) by mouth every 6 (six) hours.   mesalamine 1.2 g EC tablet Commonly known as:  LIALDA Take 2 tablets (2.4 g total) by mouth 2 (two) times daily.   metoCLOPramide 10 MG tablet Commonly known as:  REGLAN Take 10 mg by mouth daily as needed (For migrane.).   norethindrone 0.35 MG tablet Commonly known as:  MICRONOR Take 1 tablet (0.35 mg total) by mouth daily. Start taking on:  May 14, 2019   omeprazole 20 MG capsule Commonly known as:  PriLOSEC Take 2 capsules (40 mg total) by mouth daily.   PrePLUS 27-1 MG Tabs Take 1 tablet by mouth daily.   traMADol 50 MG tablet Commonly known as:  ULTRAM Take 150 mg by mouth 2 (two) times daily as needed for moderate pain.       Postpartum contraception: Progesterone only pills and Vasectomy Diet: Routine Diet Activity: Advance as tolerated. Pelvic rest for 6 weeks.   Follow-up Appt: Future Appointments  Date Time Provider [place]  05/30/2019  9:15 AM Tresea Mall, CNM [facility]   Follow-up Visit:BP check in 3 days Please schedule this patient for Postpartum visit in: 4 weeks with the following provider: Any provider High risk pregnancy  complicated by: hx preE, obesity, UC, chronic opioid use Delivery mode:  SVD Anticipated Birth Control:  Plans Interval BTL or vasectomy PP Procedures needed: 2 hour GTT Schedule Integrated BH visit: no   Newborn Data: Live born female  Birth Weight:  8 lb 11oz APGAR: 9, 9  Newborn Delivery   Birth date/time:  04/16/2019 00:58:00 Delivery type:  Vaginal, Spontaneous     Baby Feeding: Breast Disposition:home with mother

## 2019-04-16 NOTE — Progress Notes (Signed)
OB/GYN Faculty Practice: Labor Progress Note  Subjective: Strip note. Plan of care discussed with RN. Patient starting to feel much more uncomfortable, progressing well. Still having thick meconium.   Objective: BP 138/89   Pulse 91   Temp 98.3 F (36.8 C) (Oral)   Resp 20   Ht 5\' 6"  (1.676 m)   Wt 130.4 kg   LMP 07/16/2018 (Exact Date) Comment: 4 months pregnant, lead shielding provided//a.c.  BMI 46.39 kg/m  Gen: strip note Dilation: 6.5 Effacement (%): 90 Cervical Position: Posterior Station: -1 Presentation: Vertex Exam by:: Henderson Newcomer, RN  Assessment and Plan: 33 y.o. W2H8527 [redacted]w[redacted]d here for IOL for post-dates.   Labor: Progressing well, continue pitocin per protocol. Anticipate SVD. -- pain control: IV fentanyl   -- PPH Risk: medium   Fetal Well-Being: EFW 59% at 33w4. Cephalic by sutures.  -- Category I - continuous fetal monitoring, FSE now in place as above  -- GBS negative    Malie Kashani S. Earlene Plater, DO OB/GYN Fellow, Faculty Practice  12:14 AM

## 2019-04-16 NOTE — Lactation Note (Signed)
This note was copied from a baby's chart. Lactation Consultation Note  Patient Name: Boy Jariel Farid [id number] Date: 04/16/2019 Reason for consult: Initial assessment;Term  40 hours old FT female who is being exclusivity BF by his mother, she's a P4 and experienced BF.  She participated in the [facility] program at the [facility] and is already familiar with hand expression. Offered assistance with latch but mom declined, she said she didn't need any assistance with BF at this point because she's BF all her other children at some point between 2-10 months.  Noticed that mom had a pacifier in the room, educated her about the risk of using pacifiers early on and how they can hurt BF, but mom told LC that she didn't want to hear about it; she said she was very tired and sleepy. LC apologized and asked her to call for assistance if needed. Encouraged 8-12 feedings STS in 24/hours and hand expression along with spoon feedings.  Maternal Data Formula Feeding for Exclusion: Yes Reason for exclusion: Mother's choice to formula and breast feed on admission Has patient been taught Hand Expression?: Yes Does the patient have breastfeeding experience prior to this delivery?: Yes  Feeding Feeding Type: Breast Fed  Interventions Interventions: Breast feeding basics reviewed  Lactation Tools Discussed/Used WIC Program: Yes   Consult Status Consult Status: PRN Follow-up type: In-patient    Qasim Diveley Venetia Constable 04/16/2019, 4:30 PM

## 2019-04-17 NOTE — Progress Notes (Signed)
Post Partum Day 1 Subjective: up ad lib, voiding, tolerating PO and + flatus   Patient reports hip pain that is shooting down into her leg. She reports something similar after the birth of her last child. She also has back pain, and has been using opioid pain medication long term.   Objective: Blood pressure 113/73, pulse 70, temperature 98.4 F (36.9 C), temperature source Oral, resp. rate 20, height 5\' 6"  (1.676 m), weight 130.4 kg, last menstrual period 07/16/2018, SpO2 100 %, unknown if currently breastfeeding.  Physical Exam:  General: alert, cooperative and no distress Lochia: appropriate Uterine Fundus: firm Incision: NA DVT Evaluation: No evidence of DVT seen on physical exam.  Recent Labs    04/15/19 0818  HGB 11.5*  HCT 36.3    Assessment/Plan: Plan for discharge tomorrow, Breastfeeding and Contraception vasectomy   LOS: 2 days   Thressa Sheller DNP, CNM  04/17/19  10:11 AM
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
## 2019-04-17 NOTE — Lactation Note (Signed)
This note was copied from a baby's chart. Lactation Consultation Note  Patient Name: Vanessa Nolan [id number] Date: 04/17/2019 Reason for consult: Follow-up assessment;Term  P4 mother whose infant is now 1 hours old.  Mother breast fed her other children for varying lengths of time from 2 months-10 months.  Mother was very polite but made it clear that she does not need any assistance.  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    We had a short discussion and I offered to put her down as a "call as needed" patient.  Mother is happy about this.  She stated that the [facility] from yesterday "gave her a lecture" on the pacifier use but mother has used pacifiers with every child and has never had any difficulties.  She did talk about the medications she was on and I verified three of them with [employer].  All medications are compatible with breast feeding.  Mother is trying to decrease her medications at home and wanted to verify the safety of her current regimen.    I reassured her that our services continue to be available if she needs help or has questions at any time between now and discharge.  Mother appreciative and will call if needed.  She does not have a DEBP for home use but has never had a need for one.  I informed her that she will receive a manual pump at discharge.   Maternal Data Formula Feeding for Exclusion: Yes Reason for exclusion: Mother's choice to formula and breast feed on admission Has patient been taught Hand Expression?: Yes Does the patient have breastfeeding experience prior to this delivery?: Yes  Feeding Feeding Type: Breast Fed  LATCH Score Latch: Grasps breast easily, tongue down, lips flanged, rhythmical sucking.  Audible Swallowing: Spontaneous and intermittent  Type of Nipple: Everted at rest and after stimulation  Comfort (Breast/Nipple): Soft / non-tender  Hold  (Positioning): No assistance needed to correctly position infant at breast.  LATCH Score: 10  Interventions    Lactation Tools Discussed/Used WIC Program: Yes   Consult Status Consult Status: PRN Date: 04/17/19 Follow-up type: Call as needed    Irene Pap Talani Brazee 04/17/2019, 4:47 PM

## 2019-04-18 MED ORDER — HYDROCHLOROTHIAZIDE 25 MG PO TABS: 25.0000 mg | ORAL_TABLET | Freq: Every day | ORAL | 1 refills | Status: DC

## 2019-04-18 MED ORDER — ACETAMINOPHEN-CODEINE #3 300-30 MG PO TABS: 2.0000 | ORAL_TABLET | Freq: Every evening | ORAL | 0 refills | Status: DC | PRN

## 2019-04-18 MED ORDER — NORETHINDRONE 0.35 MG PO TABS: 1.0000 | ORAL_TABLET | Freq: Every day | ORAL | 12 refills | Status: DC

## 2019-04-18 MED ORDER — IBUPROFEN 600 MG PO TABS: 600.0000 mg | ORAL_TABLET | Freq: Four times a day (QID) | ORAL | 1 refills | Status: DC

## 2019-04-18 NOTE — Discharge Instructions (Signed)
Vaginal Delivery, Care After °Refer to this sheet in the next few weeks. These instructions provide you with information about caring for yourself after vaginal delivery. Your health care provider may also give you more specific instructions. Your treatment has been planned according to current medical practices, but problems sometimes occur. Call your health care provider if you have any problems or questions. °What can I expect after the procedure? °After vaginal delivery, it is common to have: °· Some bleeding from your vagina. °· Soreness in your abdomen, your vagina, and the area of skin between your vaginal opening and your anus (perineum). °· Pelvic cramps. °· Fatigue. °Follow these instructions at home: °Medicines °· Take over-the-counter and prescription medicines only as told by your health care provider. °· If you were prescribed an antibiotic medicine, take it as told by your health care provider. Do not stop taking the antibiotic until it is finished. °Driving ° °· Do not drive or operate heavy machinery while taking prescription pain medicine. °· Do not drive for 24 hours if you received a sedative. °Lifestyle °· Do not drink alcohol. This is especially important if you are breastfeeding or taking medicine to relieve pain. °· Do not use tobacco products, including cigarettes, chewing tobacco, or e-cigarettes. If you need help quitting, ask your health care provider. °Eating and drinking °· Drink at least 8 eight-ounce glasses of water every day unless you are told not to by your health care provider. If you choose to breastfeed your baby, you may need to drink more water than this. °· Eat high-fiber foods every day. These foods may help prevent or relieve constipation. High-fiber foods include: °? Whole grain cereals and breads. °? Brown rice. °? Beans. °? Fresh fruits and vegetables. °Activity °· Return to your normal activities as told by your health care provider. Ask your health care provider what  activities are safe for you. °· Rest as much as possible. Try to rest or take a nap when your baby is sleeping. °· Do not lift anything that is heavier than your baby or 10 lb (4.5 kg) until your health care provider says that it is safe. °· Talk with your health care provider about when you can engage in sexual activity. This may depend on your: °? Risk of infection. °? Rate of healing. °? Comfort and desire to engage in sexual activity. °Vaginal Care °· If you have an episiotomy or a vaginal tear, check the area every day for signs of infection. Check for: °? More redness, swelling, or pain. °? More fluid or blood. °? Warmth. °? Pus or a bad smell. °· Do not use tampons or douches until your health care provider says this is safe. °· Watch for any blood clots that may pass from your vagina. These may look like clumps of dark red, brown, or black discharge. °General instructions °· Keep your perineum clean and dry as told by your health care provider. °· Wear loose, comfortable clothing. °· Wipe from front to back when you use the toilet. °· Ask your health care provider if you can shower or take a bath. If you had an episiotomy or a perineal tear during labor and delivery, your health care provider may tell you not to take baths for a certain length of time. °· Wear a bra that supports your breasts and fits you well. °· If possible, have someone help you with household activities and help care for your baby for at least a few days after you   leave the hospital.  Keep all follow-up visits for you and your baby as told by your health care provider. This is important. Contact a health care provider if:  You have: ? Vaginal discharge that has a bad smell. ? Difficulty urinating. ? Pain when urinating. ? A sudden increase or decrease in the frequency of your bowel movements. ? More redness, swelling, or pain around your episiotomy or vaginal tear. ? More fluid or blood coming from your episiotomy or vaginal  tear. ? Pus or a bad smell coming from your episiotomy or vaginal tear. ? A fever. ? A rash. ? Little or no interest in activities you used to enjoy. ? Questions about caring for yourself or your baby.  Your episiotomy or vaginal tear feels warm to the touch.  Your episiotomy or vaginal tear is separating or does not appear to be healing.  Your breasts are painful, hard, or turn red.  You feel unusually sad or worried.  You feel nauseous or you vomit.  You pass large blood clots from your vagina. If you pass a blood clot from your vagina, save it to show to your health care provider. Do not flush blood clots down the toilet without having your health care provider look at them.  You urinate more than usual.  You are dizzy or light-headed.  You have not breastfed at all and you have not had a menstrual period for 12 weeks after delivery.  You have stopped breastfeeding and you have not had a menstrual period for 12 weeks after you stopped breastfeeding. Get help right away if:  You have: ? Pain that does not go away or does not get better with medicine. ? Chest pain. ? Difficulty breathing. ? Blurred vision or spots in your vision. ? Thoughts about hurting yourself or your baby.  You develop pain in your abdomen or in one of your legs.  You develop a severe headache.  You faint.  You bleed from your vagina so much that you fill two sanitary pads in one hour. This information is not intended to replace advice given to you by your health care provider. Make sure you discuss any questions you have with your health care provider. Document Released: 11/13/2000 Document Revised: 04/29/2016 Document Reviewed: 12/01/2015 Elsevier Interactive Patient Education  2019 [employer]   Hypertension During Pregnancy  Hypertension, commonly called high blood pressure, is when the force of blood pumping through your arteries is too strong. Arteries are blood vessels that carry blood  from the heart throughout the body. Hypertension during pregnancy can cause problems for you and your baby. Your baby may be born early (prematurely) or may not weigh as much as he or she should at birth. Very bad cases of hypertension during pregnancy can be life-threatening. Different types of hypertension can occur during pregnancy. These include:  Chronic hypertension. This happens when: ? You have hypertension before pregnancy and it continues during pregnancy. ? You develop hypertension before you are [redacted] weeks pregnant, and it continues during pregnancy.  Gestational hypertension. This is hypertension that develops after the 20th week of pregnancy.  Preeclampsia, also called toxemia of pregnancy. This is a very serious type of hypertension that develops during pregnancy. It can be very dangerous for you and your baby. ? In rare cases, you may develop preeclampsia after giving birth (postpartum preeclampsia). This usually occurs within 48 hours after childbirth but may occur up to 6 weeks after giving birth. Gestational hypertension and preeclampsia usually go  away within 6 weeks after your baby is born. Women who have hypertension during pregnancy have a greater chance of developing hypertension later in life or during future pregnancies. What are the causes? The exact cause of hypertension during pregnancy is not known. What increases the risk? There are certain factors that make it more likely for you to develop hypertension during pregnancy. These include:  Having hypertension during a previous pregnancy or prior to pregnancy.  Being overweight.  Being age 32 or older.  Being pregnant for the first time.  Being pregnant with more than one baby.  Becoming pregnant using fertilization methods such as IVF (in vitro fertilization).  Having diabetes, kidney problems, or systemic lupus erythematosus.  Having a family history of hypertension. What are the signs or symptoms? Chronic  hypertension and gestational hypertension rarely cause symptoms. Preeclampsia causes symptoms, which may include:  Increased protein in your urine. Your health care provider will check for this at every visit before you give birth (prenatal visit).  Severe headaches.  Sudden weight gain.  Swelling of the hands, face, legs, and feet.  Nausea and vomiting.  Vision problems, such as blurred or double vision.  Numbness in the face, arms, legs, and feet.  Dizziness.  Slurred speech.  Sensitivity to bright lights.  Abdominal pain.  Convulsions or seizures. How is this diagnosed? You may be diagnosed with hypertension during a routine prenatal exam. At each prenatal visit, you may:  Have a urine test to check for high amounts of protein in your urine.  Have your blood pressure checked. A blood pressure reading is given as two numbers, such as "120 over 80" (or 120/80). The first ("top") number is a measure of the pressure in your arteries when your heart beats (systolic pressure). The second ("bottom") number is a measure of the pressure in your arteries as your heart relaxes between beats (diastolic pressure). Blood pressure is measured in a unit called mm Hg. For most women, a normal blood pressure reading is: ? Systolic: below 120. ? Diastolic: below 80. The type of hypertension that you are diagnosed with depends on your test results and when your symptoms developed.  Chronic hypertension is usually diagnosed before 20 weeks of pregnancy.  Gestational hypertension is usually diagnosed after 20 weeks of pregnancy.  Hypertension with high amounts of protein in the urine is diagnosed as preeclampsia.  Blood pressure measurements that stay above 160 systolic, or above 110 diastolic, are signs of severe preeclampsia. How is this treated? Treatment for hypertension during pregnancy varies depending on the type of hypertension you have and how serious it is.  If you take medicines  called ACE inhibitors to treat chronic hypertension, you may need to switch medicines. ACE inhibitors should not be taken during pregnancy.  If you have gestational hypertension, you may need to take blood pressure medicine.  If you are at risk for preeclampsia, your health care provider may recommend that you take a low-dose aspirin during your pregnancy.  If you have severe preeclampsia, you may need to be hospitalized so you and your baby can be monitored closely. You may also need to take medicine (magnesium sulfate) to prevent seizures and to lower blood pressure. This medicine may be given as an injection or through an IV.  In some cases, if your condition gets worse, you may need to deliver your baby early. Follow these instructions at home: Eating and drinking   Drink enough fluid to keep your urine pale yellow.  Avoid  caffeine. Lifestyle  Do not use any products that contain nicotine or tobacco, such as cigarettes and e-cigarettes. If you need help quitting, ask your health care provider.  Do not use alcohol or drugs.  Avoid stress as much as possible. Rest and get plenty of sleep. General instructions  Take over-the-counter and prescription medicines only as told by your health care provider.  While lying down, lie on your left side. This keeps pressure off your major blood vessels.  While sitting or lying down, raise (elevate) your feet. Try putting some pillows under your lower legs.  Exercise regularly. Ask your health care provider what kinds of exercise are best for you.  Keep all prenatal and follow-up visits as told by your health care provider. This is important. Contact a health care provider if:  You have symptoms that your health care provider told you may require more treatment or monitoring, such as: ? Nausea or vomiting. ? Headache. Get help right away if you have:  Severe abdominal pain that does not get better with treatment.  A severe headache  that does not get better.  Vomiting that does not get better.  Sudden, rapid weight gain.  Sudden swelling in your hands, ankles, or face.  Vaginal bleeding.  Blood in your urine.  Fewer movements from your baby than usual.  Blurred or double vision.  Muscle twitching or sudden muscle tightening (spasms).  Shortness of breath.  Blue fingernails or lips. Summary  Hypertension, commonly called high blood pressure, is when the force of blood pumping through your arteries is too strong.  Hypertension during pregnancy can cause problems for you and your baby.  Treatment for hypertension during pregnancy varies depending on the type of hypertension you have and how serious it is.  Get help right away if you have symptoms that your health care provider told you to watch for. This information is not intended to replace advice given to you by your health care provider. Make sure you discuss any questions you have with your health care provider. Document Released: 08/04/2011 Document Revised: 11/02/2017 Document Reviewed: 05/01/2016 Elsevier Interactive Patient Education  2019 [employer].  Vasectomy  (Up-to-date Beyond the Basics)  VASECTOMY DEFINITION  Sperm are produced in the testicles and then move into the epididymis, which sits on the upper surface of each testicle. Sperm are stored in the epididymis, where they mature and become capable of fertilization. At the time of ejaculation, seminal fluid and sperm move from the epididymis through the vas deferens and are then expelled from the penis. The vas deferens are long, thin tubes that start in the scrotum and run behind the bladder and then return forward to empty into the urethra inside of the prostate gland; the urethra is the tube inside the penis that carries urine and semen. When a vasectomy is performed, each vas deferens is cut and cauterized (sometimes sutured closed) to prevent sperm from leaving the epididymis. This  way, no sperm are expelled from the penis at the time of ejaculation. (See "Vasectomy".)  WHO SHOULD CONSIDER A VASECTOMY?  Vasectomy is for men who want ALL of the following from their birth control method: ?Permanent, not meant to be reversed ?No preparation before or during sex ?A high success rate and low risk of complications ?No other biological children Men who are not sure that vasectomy is right for them can consider a number of other contraceptive options.  VASECTOMY SUCCESS RATES  Vasectomy is successful in more than 99 percent  of men. A second method of birth control is necessary until testing is done to confirm that there are no sperm in the semen.  The sperm count is checked, usually three months after the procedure, to ensure that no sperm remain in the ejaculate. A man needs to have ejaculated at least 20 times after vasectomy to clear the ducts of sperm before the follow-up sperm count.  A sperm count requires that the man give a semen sample, usually obtained by masturbation. A man who continues to have sperm in the ejaculate requires a second sperm count, usually performed two months later. If the follow-up check shows sperm that do not move, there is a small chance that a partner may become pregnant. Another method of contraception should be continued until "special clearance" is given by the doctor.  VASECTOMY PROCEDURE  Consultative visit--Men who are considering a vasectomy usually have a consult visit before the procedure. At this visit, the doctor will explain the procedure and answer any questions. Men frequently have a great deal of anxiety about vasectomy. Much of the anxiety is related to a fear of pain or damage to the penis or scrotum. The consult visit is an excellent opportunity to discuss the procedure, risks, and potential complications. It is preferable for the man to bring his partner to this visit.  The procedure--Most vasectomies are performed in a  physician's office and take about 30 minutes. ?A local anesthetic is injected under the skin (not into the testicle) using a very small needle or a jet injector ("puff of air"). Only the area around the vas deferens (vas or sperm duct) becomes numb. The injection of the local anesthetic will sting briefly. ?Once the area is numb, some men will still feel a pulling and/or cramping sensation during the procedure. The physician finds the vas deferens by feeling through the skin of the scrotum.  ?To expose the vas, a small puncture is made using the no-scalpel technique. A small incision can also be used. A loop of vas is brought to the surface and a small segment is removed (up to about 1 cm). The two cut surfaces of the vas are then either cauterized (treated with heat), tied off, or clipped. Once the procedure is complete on one vas deferens, the other side is treated. Often, the second vas can be treated through the same initial puncture or incision. ?In the no-scalpel technique, the skin puncture does not require sutures. If an incision is made, the skin edges are closed with stitches. ?Tight underwear or an athletic supporter (jock strap) is used to hold a bandage in place and apply pressure to the scrotum. After the procedure--The man may go home a few minutes after the vasectomy is completed but should have someone available to accompany him and assist with tasks (driving, heavy lifting, etc.). ?The most important factor in a smooth recovery is rest. It is best to take it easy for two to three days after the procedure. ?Applying an ice pack intermittently on top of the underwear helps minimize discomfort and swelling. ?Patients should be asked to limit their activities for about five days. Strenuous exercise or lifting should be avoided for a total of seven days. ?Patients should not bathe or swim for 24 to 48 hours after the procedure. ?Sexual intercourse can be resumed after a week, but a backup  method of birth control is necessary until testing is done to confirm that sperm are no longer present in the ejaculate (usually at three  months after the procedure). ?Men need to ejaculate at least 20 times to clear the ducts of sperm before the three-month check. (See 'Vasectomy success rates' above.)  PAIN CONTROL FOLLOWING VASECTOMY  After the vasectomy, there may be some cramping and discomfort of the scrotum. This can be relieved by ice pack application and with a pain medication such as acetaminophen (Tylenol). Ibuprofen and aspirin should be avoided for at least one week because these medications may increase the risk of bruising or bleeding around the incision. For more severe pain, a stronger pain medication may be prescribed. Most men find that they do not need the stronger medication.  VASECTOMY COMPLICATIONS  Vasectomy is a relatively simple procedure to perform. Complications are unusual, although possible. ?Bleeding - Excessive bleeding occurs in less than 5 percent of men who undergo vasectomy. Most bleeding problems occur within the first 48 hours after the procedure. Bleeding within the scrotum can lead to a hematoma, which is an expanding mass of blood within the tissues around the vas. This can grow to a large size if not treated promptly. Men can help to prevent bleeding by avoiding aspirin, ibuprofen, and other nonsteroidal anti-inflammatory drugs (NSAIDs) for at least one week prior to surgery, and by following the instructions regarding rest and limited activity after surgery. ?Infection - Infection occurs in up to 4 percent of men who undergo vasectomy. This typically involves the scrotal skin around the incision. Occasionally, the epididymis will become swollen after a vasectomy. This is usually treated with a short course of oral antibiotics. ?Sperm granuloma - A sperm granuloma occurs in 15 to 40 percent of men who undergo vasectomy. A sperm granuloma is a mass that develops  over time as a result of the body's immune reaction to sperm leaking from the cut end of the vas. It is typically treated with an anti-inflammatory medication, such as ibuprofen. The mass is not dangerous. There are rare instances, however, in which a sperm granuloma causes significant scrotal discomfort. This may be treated by surgically removing the granuloma. ?Post-vasectomy pain syndrome - This condition is thought to result from buildup of fluid in the epididymis leading to a chronic dull ache in the testes. There is some controversy as to how commonly this condition occurs. Historically, rates for post-vasectomy pain syndrome have been reported as very low (<1 percent). However, surveys have found that the incidence of "troublesome" post-vasectomy pain is reported by approximately 15 percent of men, with pain severe enough to affect quality of life in 2 percent. However, survey respondents may not have been representative of all men who have had a vasectomy. The preferred therapy for post-vasectomy pain syndrome is nonsteroidal anti-inflammatory medications (NSAIDs), such as ibuprofen (sample brand names: Advil, Motrin) or naproxen (sample brand name: Aleve), and warm baths. If these measures are not enough to relieve pain, local nerve blocks or steroid injections may be performed by a pain specialist. Cases that do not respond to therapy may require surgery, including possibly a vasectomy reversal.  HEALTH EFFECTS OF VASECTOMY  Sex drive--Having a vasectomy will not affect testosterone (female hormone) levels, sex drive, or the ability to have an erection. Risk of cancer--Although there have been some concerns regarding a link between vasectomy and prostate and testicular cancer, several large studies show that there is no increased risk of any cancer following vasectomy [1-5]. Heart disease--Similar to the situation with cancer, there have been some concerns about a link between vasectomy and  heart disease; however, studies have found  no such link.  CHILDREN AFTER VASECTOMY  A man should not have a vasectomy unless he is sure that he does not want to be able to biologically father children in the future. However, about 5 percent of men who have a vasectomy eventually decide that they would like to have it reversed. Sperm antibodies develop in 40 percent of men after a vasectomy. These are formed when leaked sperm cells interact with the body's immune system. These antibodies cause no harm to the man but can make sperm cells less effective if the vasectomy is reversed.  VASECTOMY REVERSAL  Vasectomy reversal is called a vasovasostomy. This is a microsurgical technique that reconnects the vas deferens. The success rate of this procedure depends upon the condition of the vas. As more time elapses from the time of the vasectomy, vasovasostomy is less likely to be successful (ie, result in pregnancy) [6,7]. In one study, vasovasostomy resulted in pregnancy about 76 percent of the time when performed three years or less from the time of the vasectomy, but pregnancy resulted in 30 percent when the vasectomy was performed 15 years or more before reversal [6]. If sperm antibodies have developed, fertility after vasovasostomy is further reduced. In the [place], vasectomy reversal is not covered by [employer]. Sperm banking--Sperm banking involves storing a collected sample of sperm with a preservative at a very low temperature. Sperm banking can be accomplished prior to vasectomy or collection of sperm from the ducts can be done at the time of vasectomy reversal surgery. The initial cost of storing sperm is about $500, with annual storage costs between $300 and $1000 per year.   BIRTH CONTROL AFTER VASECTOMY  If the three-month check shows no evidence of residual sperm in the ejaculate, a second form of birth control (eg, condoms) is no longer needed to prevent pregnancy.  However, vasectomy does not protect against sexually transmitted diseases (STDs) such as HIV. Men who have more than one sexual partner and men whose partner has other partners should consider using condoms to reduce the risk of STDs.

## 2019-04-18 NOTE — Lactation Note (Signed)
This note was copied from a baby's chart. Lactation Consultation Note  Patient Name: Vanessa Nolan Date: 04/18/2019   Per previous LC note, I informed Newell Coral, RN that I would not visit Mom unless mother requested lactation specifically or RN deemed necessary.   Mom is a P5. Most recent LATCH scores are 9-10. Mom's feeding choice is BR/FO.  Lurline Hare [facility] 04/18/2019, 9:01 AM

## 2019-04-19 ENCOUNTER — Ambulatory Visit: Payer: Self-pay

## 2019-04-19 NOTE — Lactation Note (Signed)
This note was copied from a baby's chart. Lactation Consultation Note  Patient Name: Vanessa Nolan NIDPO'E Date: 04/19/2019 Reason for consult: Follow-up assessment   P5, Baby 80 hours old and latched upon entering in cradle hold w/ intermittent swallows.  Mothers breasts filling.  Mother has history of mastitis.  Discussed prevention.  Mother requested hand pump.  Fitted her with #27 flange. Feed on demand approximately 8-12 times per day.   Reviewed engorgement care and monitoring voids/stools.    Maternal Data    Feeding Feeding Type: Breast Fed  LATCH Score Latch: Grasps breast easily, tongue down, lips flanged, rhythmical sucking.(latched upon enteirng)  Audible Swallowing: A few with stimulation  Type of Nipple: Everted at rest and after stimulation  Comfort (Breast/Nipple): Soft / non-tender  Hold (Positioning): No assistance needed to correctly position infant at breast.  LATCH Score: 9  Interventions Interventions: Hand pump  Lactation Tools Discussed/Used     Consult Status Consult Status: Complete Date: 04/19/19    Dahlia Byes [facility] 04/19/2019, 8:58 AM

## 2019-05-02 ENCOUNTER — Telehealth: Payer: Self-pay

## 2019-05-02 ENCOUNTER — Ambulatory Visit: Payer: Medicaid Other

## 2019-05-02 NOTE — Telephone Encounter (Signed)
The patient called in very upset regarding the appointments. Stated we made appointments before consulting with her. Informed the patient of the received messages when delivering. The patient understood however she stated she does not have a ride today. Informed of the voicemail regarding the appointment. The patient reverted back to the upcoming appointments stating its to early for her to get up and she's not coming. She doubts that she will come to any of the appointments. The patient was stated her son keeps her up all night. Informed of fasting and the reason for the early visit. Also informed the last two appointments are the same day 4 weeks from now. The patient stated she still isn't going to get up that early. When asked if she would like me to cancel the patient stated I don't know. Informed the patient to give [place] call if anything changes or she cant make it. The patient did not want to reschedule the blood pressure check. She stated the hospital wanted her to have it done a week ago. When asked if she has blood pressure cuffs at home? The nurse could call. She stated well obviously I don't. She also stated she's not going to have her husband take off work just to bring her to an appointment to have her blood pressure checked.

## 2019-05-29 ENCOUNTER — Telehealth: Payer: Self-pay | Admitting: Medical

## 2019-05-29 NOTE — Telephone Encounter (Signed)
Patient called to say she was not able to come to her appointment. She has no one to watch her kids, and her husband doesn't get off until 11:00 am. I asked about doing a video visit. She stated just as long as it wasn't early, because she would be still sleep.

## 2019-05-30 ENCOUNTER — Telehealth (INDEPENDENT_AMBULATORY_CARE_PROVIDER_SITE_OTHER): Payer: Medicaid Other | Admitting: Advanced Practice Midwife

## 2019-05-30 ENCOUNTER — Ambulatory Visit: Payer: Medicaid Other | Admitting: Medical

## 2019-05-30 ENCOUNTER — Other Ambulatory Visit: Payer: Medicaid Other

## 2019-05-30 ENCOUNTER — Telehealth: Payer: Medicaid Other | Admitting: Advanced Practice Midwife

## 2019-05-30 ENCOUNTER — Other Ambulatory Visit: Payer: Self-pay

## 2019-05-30 MED ORDER — NYSTATIN-TRIAMCINOLONE 100000-0.1 UNIT/GM-% EX CREA: 1.0000 "application " | TOPICAL_CREAM | Freq: Three times a day (TID) | CUTANEOUS | 0 refills | Status: AC

## 2019-05-30 NOTE — Progress Notes (Signed)
I connected with  Vanessa Nolan on 05/30/19 at  4:15 PM EDT by  and verified that I am speaking with the correct person using two identifiers.   I discussed the limitations, risks, security and privacy concerns of performing an evaluation and management service by telephone and the availability of in person appointments. I also discussed with the patient that there may be a patient responsible charge related to this service. The patient expressed understanding and agreed to proceed.  Alric Seton, CMA 05/30/2019  4:27 PM    SVD No Epidural  Breast No bleeding  Bowel  Bladder ok Edinburgh negative  Pill/ haven't started yet

## 2019-05-30 NOTE — Progress Notes (Signed)
TELEHEALTH VIRTUAL POSTPARTUM VISIT ENCOUNTER NOTE  I connected with Vanessa Nolan  on 05/30/19 at  4:15 PM EDT by telephone at home and verified that I am speaking with the correct person using two identifiers.   I discussed the limitations, risks, security and privacy concerns of performing an evaluation and management service by telephone and the availability of in person appointments. I also discussed with the patient that there may be a patient responsible charge related to this service. The patient expressed understanding and agreed to proceed.  Appointment Date: 05/30/2019  [facility]  Chief Complaint:  Chief Complaint  Patient presents with  . Postpartum Care    History of Present Illness: Vanessa Nolan is a 33 y.o. Caucasian [id number] (No LMP recorded.), seen for the above chief complaint. Her past medical history is significant for [facility], Obesity, chronic pain   She is s/p normal spontaneous vaginal delivery on 04/16/2019 at 41 weeks; she was discharged to home on PPD#2. Pregnancy complicated by none. Baby is doing well.  Complains of hip popping and rash on breast. States that she "dislocated" her hip after her last child. She states that this does not feel similar, but it popping when she walks. No pain. Rash on breast. Has been taking benadryl. It may be helping.   Vaginal bleeding or discharge: Yes  Mode of feeding infant: Breast Intercourse: No  Contraception: POPs PP depression s/s: No .  Any bowel or bladder issues: No  Pap smear: no abnormalities (date: 01/2019)  Review of Systems: Her 12 point review of systems is negative or as noted in the History of Present Illness.  Patient Active Problem List   Diagnosis Date Noted  . Post term pregnancy at [redacted] weeks gestation 04/15/2019  . Excessive weight gain in pregnancy 04/10/2019  . Abnormal glucose affecting pregnancy 02/13/2019  . Obesity in pregnancy 02/06/2019  . BMI 45.0-49.9, adult ([place]) 02/06/2019  .  Chronic prescription opiate use 02/06/2019  . Late prenatal care 02/06/2019  . History of preterm delivery 02/06/2019  . Fetal 2cm subdiaphragmatic mass on ultrasound 01/25/2019  . Supervision of high risk pregnancy, antepartum 01/20/2019  . Ulcerative colitis ([place]) 01/20/2019  . History of pre-eclampsia in prior pregnancy, currently pregnant 01/20/2019  . Chronic back pain 01/20/2019  . History of depression 01/20/2019  . Hx of migraine headaches 01/20/2019    Medications Vanessa Nolan had no medications administered during this visit. Current Outpatient Medications  Medication Sig Dispense Refill  . acetaminophen (TYLENOL) 500 MG tablet Take 1,000 mg by mouth every 6 (six) hours as needed for mild pain or headache.     Marland Kitchen acetaminophen-codeine (TYLENOL #3) 300-30 MG tablet Take 2 tablets by mouth at bedtime as needed for severe pain. Do not take within 6 hours of Tramadol 10 tablet 0  . albuterol (VENTOLIN HFA) 108 (90 Base) MCG/ACT inhaler Inhale 2 puffs into the lungs every 6 (six) hours as needed for wheezing or shortness of breath.    . cyclobenzaprine (FLEXERIL) 10 MG tablet Take 1 tablet (10 mg total) by mouth every 8 (eight) hours as needed for muscle spasms. 30 tablet 0  . hydrochlorothiazide (HYDRODIURIL) 25 MG tablet Take 1 tablet (25 mg total) by mouth daily. 7 tablet 1  . ibuprofen (ADVIL) 600 MG tablet Take 1 tablet (600 mg total) by mouth every 6 (six) hours. 30 tablet 1  . mesalamine (LIALDA) 1.2 g EC tablet Take 2 tablets (2.4 g total) by mouth 2 (two) times  daily. 120 tablet 3  . metoCLOPramide (REGLAN) 10 MG tablet Take 10 mg by mouth daily as needed (For migrane.).    Marland Kitchen norethindrone (MICRONOR) 0.35 MG tablet Take 1 tablet (0.35 mg total) by mouth daily. 1 Package 12  . omeprazole (PRILOSEC) 20 MG capsule Take 2 capsules (40 mg total) by mouth daily. 90 capsule 1  . Prenatal Vit-Fe Fumarate-FA (PREPLUS) 27-1 MG TABS Take 1 tablet by mouth daily. 30 tablet 3  . traMADol  (ULTRAM) 50 MG tablet Take 150 mg by mouth 2 (two) times daily as needed for moderate pain.      No current facility-administered medications for this visit.     Allergies Morphine and related  Physical Exam:  General:  Alert, oriented and cooperative.   Mental Status: Normal mood and affect perceived. Normal judgment and thought content.  Rest of physical exam deferred due to type of encounter  Patient showed me the rash on her breast. It looks like tinea   PP Depression Screening:    Edinburgh Postnatal Depression Scale - 05/30/19 1629      Edinburgh Postnatal Depression Scale:  In the Past 7 Days   I have been able to laugh and see the funny side of things.  0    I have looked forward with enjoyment to things.  0    I have blamed myself unnecessarily when things went wrong.  0    I have been anxious or worried for no good reason.  0    I have felt scared or panicky for no good reason.  0    Things have been getting on top of me.  0    I have been so unhappy that I have had difficulty sleeping.  0    I have felt sad or miserable.  0    I have been so unhappy that I have been crying.  0    The thought of harming myself has occurred to me.  0    Edinburgh Postnatal Depression Scale Total  0        Assessment:Patient is a 33 y.o. [id number] who is 5 weeks postpartum from a normal spontaneous vaginal delivery.  She is doing well.   Plan: 1. Postpartum care and examination    Ok to start POPs. Already has RX RX sent for antifungal cream for breast Continue to monitor hip, may need ortho referral  RTC 1 year   I discussed the assessment and treatment plan with the patient. The patient was provided an opportunity to ask questions and all were answered. The patient agreed with the plan and demonstrated an understanding of the instructions.   The patient was advised to call back or seek an in-person evaluation/go to the ED for any concerning postpartum symptoms.  I provided 15  minutes of non-face-to-face time during this encounter.   Marcille Buffy DNP, CNM  05/30/19  5:09 PM  [facility]

## 2019-09-11 ENCOUNTER — Other Ambulatory Visit: Payer: Self-pay | Admitting: Gastroenterology

## 2019-10-16 ENCOUNTER — Other Ambulatory Visit: Payer: Self-pay

## 2019-10-16 ENCOUNTER — Encounter (HOSPITAL_COMMUNITY): Payer: Self-pay | Admitting: Emergency Medicine

## 2019-10-16 ENCOUNTER — Ambulatory Visit (HOSPITAL_COMMUNITY)
Admission: EM | Admit: 2019-10-16 | Discharge: 2019-10-16 | Disposition: A | Discharge status: Home / Self Care | Payer: Medicaid Other | Attending: Internal Medicine | Admitting: Internal Medicine

## 2019-10-16 DIAGNOSIS — M545 Low back pain, unspecified: Secondary | ICD-10-CM

## 2019-10-16 MED ORDER — ACETAMINOPHEN-CODEINE #3 300-30 MG PO TABS: 1.0000 | ORAL_TABLET | Freq: Three times a day (TID) | ORAL | 0 refills | Status: DC | PRN

## 2019-10-16 MED ORDER — TRAMADOL HCL 50 MG PO TABS: 50.0000 mg | ORAL_TABLET | Freq: Four times a day (QID) | ORAL | 0 refills | Status: DC | PRN

## 2019-10-16 NOTE — Discharge Instructions (Addendum)
I have provided a few extra days of Tramadol and Tylenol #3 Please pump and dump while using and/or consult OBGYN on safety in breastfeeding  Please follow up with Primary Care and ortho/spine specialist for further management of back pain  Follow up in emergency room if developing leg weakness, numbness tingling, issues controlling urination/bowels

## 2019-10-16 NOTE — ED Provider Notes (Signed)
[facility]    CSN: [id number] Arrival date & time: 10/16/19  1722      History   Chief Complaint Chief Complaint  Patient presents with  . Back Pain    HPI Vanessa Nolan is a 33 y.o. female history of chronic back pain, migraines, currently breast-feeding presenting today for evaluation of back pain.  Patient states that on Thursday she attempted to do a plank for approximately 10 seconds and afterwards she has developed increased back pain.  States that in 2017 she had injury at work and states that she has known problems with at least 5 disks in her lower back.  She recently moved to [place] prior to [place] pandemic and has been unable to establish primary care or get set up with back doctor here.  She has been receiving refills of her medicine from a doctor in [place].  She has been taking gabapentin, Robaxin as well as tramadol.  These have eased her symptoms, but states that she is still in significant pain.  She states that in the past she has been prescribed Tylenol 3's.  She states that she has a very high pain tolerance and anti-inflammatories and steroids do not help her symptoms at all and never have.  She will have occasional radiation into legs.  Denies numbness or tingling.  Denies issues controlling urination or bowel movements.  She is also breast-feeding and states that OB/GYN has previously given her Tylenol threes for her back pain and was told that this is safe in breast-feeding.  HPI  Past Medical History:  Diagnosis Date  . Asthma   . Depression   . Headache    Migraines  . Herniated disc, cervical   . Pregnancy induced hypertension 2015  . Preterm labor   . Ulcerative colitis [facility])     Patient Active Problem List   Diagnosis Date Noted  . Post term pregnancy at [redacted] weeks gestation 04/15/2019  . Excessive weight gain in pregnancy 04/10/2019  . Abnormal glucose affecting pregnancy 02/13/2019  . Obesity in pregnancy 02/06/2019  . BMI  45.0-49.9, adult ([place]) 02/06/2019  . Chronic prescription opiate use 02/06/2019  . Late prenatal care 02/06/2019  . History of preterm delivery 02/06/2019  . Fetal 2cm subdiaphragmatic mass on ultrasound 01/25/2019  . Supervision of high risk pregnancy, antepartum 01/20/2019  . Ulcerative colitis ([place]) 01/20/2019  . History of pre-eclampsia in prior pregnancy, currently pregnant 01/20/2019  . Chronic back pain 01/20/2019  . History of depression 01/20/2019  . Hx of migraine headaches 01/20/2019    Past Surgical History:  Procedure Laterality Date  . CHOLECYSTECTOMY    . COLONOSCOPY    . KNEE SURGERY    . WISDOM TOOTH EXTRACTION      OB History    Gravida  6   Para  5   Term  4   Preterm  1   AB  1   Living  5     SAB  1   TAB      Ectopic      Multiple  0   Live Births  5            Home Medications    Prior to Admission medications   Medication Sig Start Date End Date Taking? Authorizing Provider  acetaminophen (TYLENOL) 500 MG tablet Take 1,000 mg by mouth every 6 (six) hours as needed for mild pain or headache.    Yes [provider]  gabapentin (NEURONTIN)  800 MG tablet Take 800 mg by mouth 3 (three) times daily.   Yes [provider]  mesalamine (LIALDA) 1.2 g EC tablet Take 2 tablets by mouth twice daily 09/12/19  Yes Meryl DareStark, Malcolm T, MD  methocarbamol (ROBAXIN) 750 MG tablet Take 750 mg by mouth 4 (four) times daily.   Yes [provider]  omeprazole (PRILOSEC) 20 MG capsule Take 2 capsules (40 mg total) by mouth daily. 03/20/19  Yes Levie HeritageStinson, Jacob J, DO  Prenatal Vit-Fe Fumarate-FA (PREPLUS) 27-1 MG TABS Take 1 tablet by mouth daily. 02/06/19  Yes [place] BingPickens, Charlie, MD  acetaminophen-codeine (TYLENOL #3) 300-30 MG tablet Take 1-2 tablets by mouth every 8 (eight) hours as needed for moderate pain or severe pain. 10/16/19   Maelyn Berrey C, PA-C  albuterol (VENTOLIN HFA) 108 (90 Base) MCG/ACT inhaler Inhale 2 puffs into the  lungs every 6 (six) hours as needed for wheezing or shortness of breath.    [provider]  ibuprofen (ADVIL) 600 MG tablet Take 1 tablet (600 mg total) by mouth every 6 (six) hours. 04/18/19   Katrinka BlazingSmith, [place], CNM  nystatin-triamcinolone (MYCOLOG II) cream Apply 1 application topically 3 (three) times daily. 05/30/19   Armando ReichertHogan, Heather D, CNM  traMADol (ULTRAM) 50 MG tablet Take 1 tablet (50 mg total) by mouth every 6 (six) hours as needed for severe pain. 10/16/19   Latori Beggs C, PA-C  hydrochlorothiazide (HYDRODIURIL) 25 MG tablet Take 1 tablet (25 mg total) by mouth daily. 04/18/19 10/16/19  Katrinka BlazingSmith, [place], CNM  metoCLOPramide (REGLAN) 10 MG tablet Take 10 mg by mouth daily as needed (For migrane.).  10/16/19  [provider]  norethindrone (MICRONOR) 0.35 MG tablet Take 1 tablet (0.35 mg total) by mouth daily. 05/14/19 10/16/19  Dorathy KinsmanSmith, [place], CNM    Family History Family History  Problem Relation Age of Onset  . Diabetes Father   . Cancer Brother        testicular  . Cancer Paternal Grandfather     Social History Social History   Tobacco Use  . Smoking status: Never Smoker  . Smokeless tobacco: Never Used  Substance Use Topics  . Alcohol use: Not Currently  . Drug use: Never     Allergies   Morphine and related   Review of Systems Review of Systems  Constitutional: Negative for fatigue and fever.  HENT: Negative for mouth sores.   Eyes: Negative for visual disturbance.  Respiratory: Negative for shortness of breath.   Cardiovascular: Negative for chest pain.  Gastrointestinal: Negative for abdominal pain, nausea and vomiting.  Genitourinary: Negative for decreased urine volume, difficulty urinating and genital sores.  Musculoskeletal: Positive for back pain and myalgias. Negative for arthralgias and joint swelling.  Skin: Negative for color change, rash and wound.  Neurological: Negative for dizziness, weakness, light-headedness and headaches.      Physical Exam Triage Vital Signs ED Triage Vitals  Enc Vitals Group     BP 10/16/19 1907 (!) 144/107     Pulse Rate 10/16/19 1907 74     Resp 10/16/19 1907 18     Temp 10/16/19 1907 98.2 F (36.8 C)     Temp Source 10/16/19 1907 Oral     SpO2 10/16/19 1907 99 %     Weight --      Height --      Head Circumference --      Peak Flow --      Pain Score 10/16/19 1900 6     Pain  Loc --      Pain Edu? --      Excl. in GC? --    No data found.  Updated Vital Signs BP (!) 144/107 (BP Location: Right Arm) Comment: regular cuff, forearm  Pulse 74   Temp 98.2 F (36.8 C) (Oral)   Resp 18   LMP 07/15/2018   SpO2 99%   Visual Acuity Right Eye Distance:   Left Eye Distance:   Bilateral Distance:    Right Eye Near:   Left Eye Near:    Bilateral Near:     Physical Exam Vitals signs and nursing note reviewed.  Constitutional:      General: She is not in acute distress.    Appearance: She is well-developed.  HENT:     Head: Normocephalic and atraumatic.  Eyes:     Conjunctiva/sclera: Conjunctivae normal.  Neck:     Musculoskeletal: Neck supple.  Cardiovascular:     Rate and Rhythm: Normal rate and regular rhythm.     Heart sounds: No murmur.  Pulmonary:     Effort: Pulmonary effort is normal. No respiratory distress.     Breath sounds: Normal breath sounds.  Abdominal:     Palpations: Abdomen is soft.     Tenderness: There is no abdominal tenderness.  Musculoskeletal:     Comments: Nontender to palpation of cervical, thoracic spine midline, significant midline tenderness to lumbar spine throughout, patient pulls away; tenderness throughout bilateral lumbar musculature as well  Hip and knee strength 5/5 and equal bilaterally in all directions, patellar reflex 1+ bilaterally  Patient is able to move from chair to exam table independently and without significant abnormality  Skin:    General: Skin is warm and dry.  Neurological:     Mental Status: She is alert.       UC Treatments / Results  Labs (all labs ordered are listed, but only abnormal results are displayed) Labs Reviewed - No data to display  EKG   Radiology No results found.  Procedures Procedures (including critical care time)  Medications Ordered in UC Medications - No data to display  Initial Impression / Assessment and Plan / UC Course  I have reviewed the triage vital signs and the nursing notes.  Pertinent labs & imaging results that were available during my care of the patient were reviewed by me and considered in my medical decision making (see chart for details).     Patient with acute on chronic back pain.  No red flags for cauda equina.  Discussed with patient recommendations and NSAIDs and steroids.  Declined as these have not been helpful for her in the past as well as declining trial of IM Toradol or steroids.  Registry was checked and patient had received 2 prior prescriptions in May for Tylenol 3, no recent prescriptions. I will go ahead and provide Tylenol 3 as well as a small refill of her tramadol.  Discussed concerns with breast-feeding with patient and discussed recommendations to avoid with breast-feeding.  Advised patient to pump and dump, but she may consult her OB/GYN for further recommendations of safety with this.  Discussed with patient establishing care here with primary care, spine specialist and/or pain management.  Provided with multiple contacts.  Advised we cannot refill chronic pain medicine.  Given recent move without flare since, did go ahead and provided prescriptions today, but stressed importance of establishing care for further evaluation and management of her back pain.  Discussed strict return precautions. Patient verbalized understanding  and is agreeable with plan.  Final Clinical Impressions(s) / UC Diagnoses   Final diagnoses:  Acute midline low back pain without sciatica     Discharge Instructions     I have provided a few  extra days of Tramadol and Tylenol #3 Please pump and dump while using and/or consult OBGYN on safety in breastfeeding  Please follow up with Primary Care and ortho/spine specialist for further management of back pain  Follow up in emergency room if developing leg weakness, numbness tingling, issues controlling urination/bowels   ED Prescriptions    Medication Sig Dispense Auth. Provider   traMADol (ULTRAM) 50 MG tablet Take 1 tablet (50 mg total) by mouth every 6 (six) hours as needed for severe pain. 12 tablet Deisha Stull C, PA-C   acetaminophen-codeine (TYLENOL #3) 300-30 MG tablet Take 1-2 tablets by mouth every 8 (eight) hours as needed for moderate pain or severe pain. 12 tablet Markeeta Scalf, [place] C, PA-C     I have reviewed the PDMP during this encounter.   Lew Dawes, [place] 10/16/19 2117

## 2019-10-16 NOTE — ED Triage Notes (Signed)
Patient has chronic back pain.  Patient reports back pain increased on Thursday, not sure why the escalation in pain.  Pain is mid to lower back, lower back is the worst pain.

## 2019-11-14 ENCOUNTER — Ambulatory Visit (INDEPENDENT_AMBULATORY_CARE_PROVIDER_SITE_OTHER)
Admission: RE | Admit: 2019-11-14 | Discharge: 2019-11-14 | Disposition: A | Discharge status: Home / Self Care | Payer: Medicaid Other | Source: Ambulatory Visit

## 2019-11-14 DIAGNOSIS — K0889 Other specified disorders of teeth and supporting structures: Secondary | ICD-10-CM | POA: Diagnosis not present

## 2019-11-14 DIAGNOSIS — K029 Dental caries, unspecified: Secondary | ICD-10-CM

## 2019-11-14 MED ORDER — MELOXICAM 15 MG PO TABS: 15.0000 mg | ORAL_TABLET | Freq: Every day | ORAL | 0 refills | Status: DC

## 2019-11-14 MED ORDER — CLINDAMYCIN HCL 300 MG PO CAPS: 300.0000 mg | ORAL_CAPSULE | Freq: Three times a day (TID) | ORAL | 0 refills | Status: DC

## 2019-11-14 NOTE — Discharge Instructions (Addendum)
[facility] [phone] extension [phone]  Dr. Donn Pierini [phone] [place] [phone] [address] [facility].  Rescue mission [phone] extension [phone], [place], [place], [address] First come first serve for the first 10 clients.  May do simple extractions only, no wisdom teeth or surgery.  You may try the second for Thursday of the month starting at [employer] You may call the school to see if they are still helping to provide dental care for emergent cases.

## 2019-11-14 NOTE — ED Provider Notes (Signed)
Virtual Visit via Video Note:  Vanessa Nolan  initiated request for Telemedicine visit with [facility] team. I connected with Vanessa Nolan  on 11/14/2019 at 4:36 PM  for a synchronized telemedicine visit using a video enabled HIPPA compliant telemedicine application. I verified that I am speaking with Vanessa Nolan  using two identifiers. Vanessa Eagles, PA-C  was physically located in a [facility] Urgent care site and Vanessa Nolan was located at a different location.   The limitations of evaluation and management by telemedicine as well as the availability of in-person appointments were discussed. Patient was informed that she  may incur a bill ( including co-pay) for this virtual visit encounter. Vanessa Nolan  expressed understanding and gave verbal consent to proceed with virtual visit.     History of Present Illness:Vanessa Nolan  is a 33 y.o. female presents with 1 day hx of acute onset left lower jaw pain that is sharp, severe. Has been using APAP, tramadol which is helped some but still has significant concern that the infection will become overwhelming.  Had a history of right-sided dental infection that resolved with antibiotics, patient never sought dental care.  She states that this episode on the left lower side is significantly worse.  States that she had left lower jaw swelling that is improved today.  ROS   No current facility-administered medications for this encounter.   Current Outpatient Medications  Medication Sig Dispense Refill  . acetaminophen (TYLENOL) 500 MG tablet Take 1,000 mg by mouth every 6 (six) hours as needed for mild pain or headache.     Marland Kitchen acetaminophen-codeine (TYLENOL #3) 300-30 MG tablet Take 1-2 tablets by mouth every 8 (eight) hours as needed for moderate pain or severe pain. 12 tablet 0  . albuterol (VENTOLIN HFA) 108 (90 Base) MCG/ACT inhaler Inhale 2 puffs into the lungs every 6 (six) hours as needed for wheezing or shortness of breath.     . gabapentin (NEURONTIN) 800 MG tablet Take 800 mg by mouth 3 (three) times daily.    Marland Kitchen ibuprofen (ADVIL) 600 MG tablet Take 1 tablet (600 mg total) by mouth every 6 (six) hours. 30 tablet 1  . mesalamine (LIALDA) 1.2 g EC tablet Take 2 tablets by mouth twice daily 120 tablet 0  . methocarbamol (ROBAXIN) 750 MG tablet Take 750 mg by mouth 4 (four) times daily.    Marland Kitchen nystatin-triamcinolone (MYCOLOG II) cream Apply 1 application topically 3 (three) times daily. 30 g 0  . omeprazole (PRILOSEC) 20 MG capsule Take 2 capsules (40 mg total) by mouth daily. 90 capsule 1  . Prenatal Vit-Fe Fumarate-FA (PREPLUS) 27-1 MG TABS Take 1 tablet by mouth daily. 30 tablet 3  . traMADol (ULTRAM) 50 MG tablet Take 1 tablet (50 mg total) by mouth every 6 (six) hours as needed for severe pain. 12 tablet 0     Allergies  Allergen Reactions  . Morphine And Related Other (See Comments)    tachycardia     Past Medical History:  Diagnosis Date  . Asthma   . Depression   . Headache    Migraines  . Herniated disc, cervical   . Pregnancy induced hypertension 2015  . Preterm labor   . Ulcerative colitis [facility])     Past Surgical History:  Procedure Laterality Date  . CHOLECYSTECTOMY    . COLONOSCOPY    . KNEE SURGERY    . WISDOM TOOTH EXTRACTION        Observations/Objective: Physical Exam  Constitutional:      General: She is not in acute distress.    Appearance: Normal appearance. She is well-developed. She is not ill-appearing, toxic-appearing or diaphoretic.  HENT:     Mouth/Throat:     Comments: Unable to see teeth through video due to quality. Eyes:     Extraocular Movements: Extraocular movements intact.  Pulmonary:     Effort: Pulmonary effort is normal.  Neurological:     General: No focal deficit present.     Mental Status: She is alert and oriented to person, place, and time.  Psychiatric:        Mood and Affect: Mood normal.        Behavior: Behavior normal.        Thought Content:  Thought content normal.        Judgment: Judgment normal.      Assessment and Plan:  1. Pain, dental   2. Dental caries     Patient requested clindamycin instead of amoxicillin stating that amoxicillin never helps her with ear infections.  Use meloxicam for pain and inflammation.  Recommended avoiding use of other NSAIDs as well while using meloxicam.  Emphasized need to contact dental practice for help with extraction or any other kind of procedure to resolve her dental issue.  I am agreeable to doing one course of clindamycin only.  Patient verbalized understanding that we will not continually prescribe antibiotics and is agreeable to seeking consult with a dental practice.  Information provided. Counseled patient on potential for adverse effects with medications prescribed/recommended today, ER and return-to-clinic precautions discussed, patient verbalized understanding.  Of note, patient was driving at the initiation of her office visit.  I requested that she parked her car in order for [place] to proceed.  Patient did so and visit was completed without incident.   Follow Up Instructions:    I discussed the assessment and treatment plan with the patient. The patient was provided an opportunity to ask questions and all were answered. The patient agreed with the plan and demonstrated an understanding of the instructions.   The patient was advised to call back or seek an in-person evaluation if the symptoms worsen or if the condition fails to improve as anticipated.  I provided 15 minutes of non-face-to-face time during this encounter.    Wallis Bamberg, PA-C  11/14/2019 4:36 PM         Wallis Bamberg, PA-C 11/14/19 1648

## 2020-01-26 IMAGING — DX DG CHEST 2V
2 series · 2 of 2 positions shown · non-contrast
Comparison: None.

CLINICAL DATA: Acute onset of cough, sinus congestion and fatigue.
Fever.

EXAM:
CHEST - 2 VIEW

[chest pa]
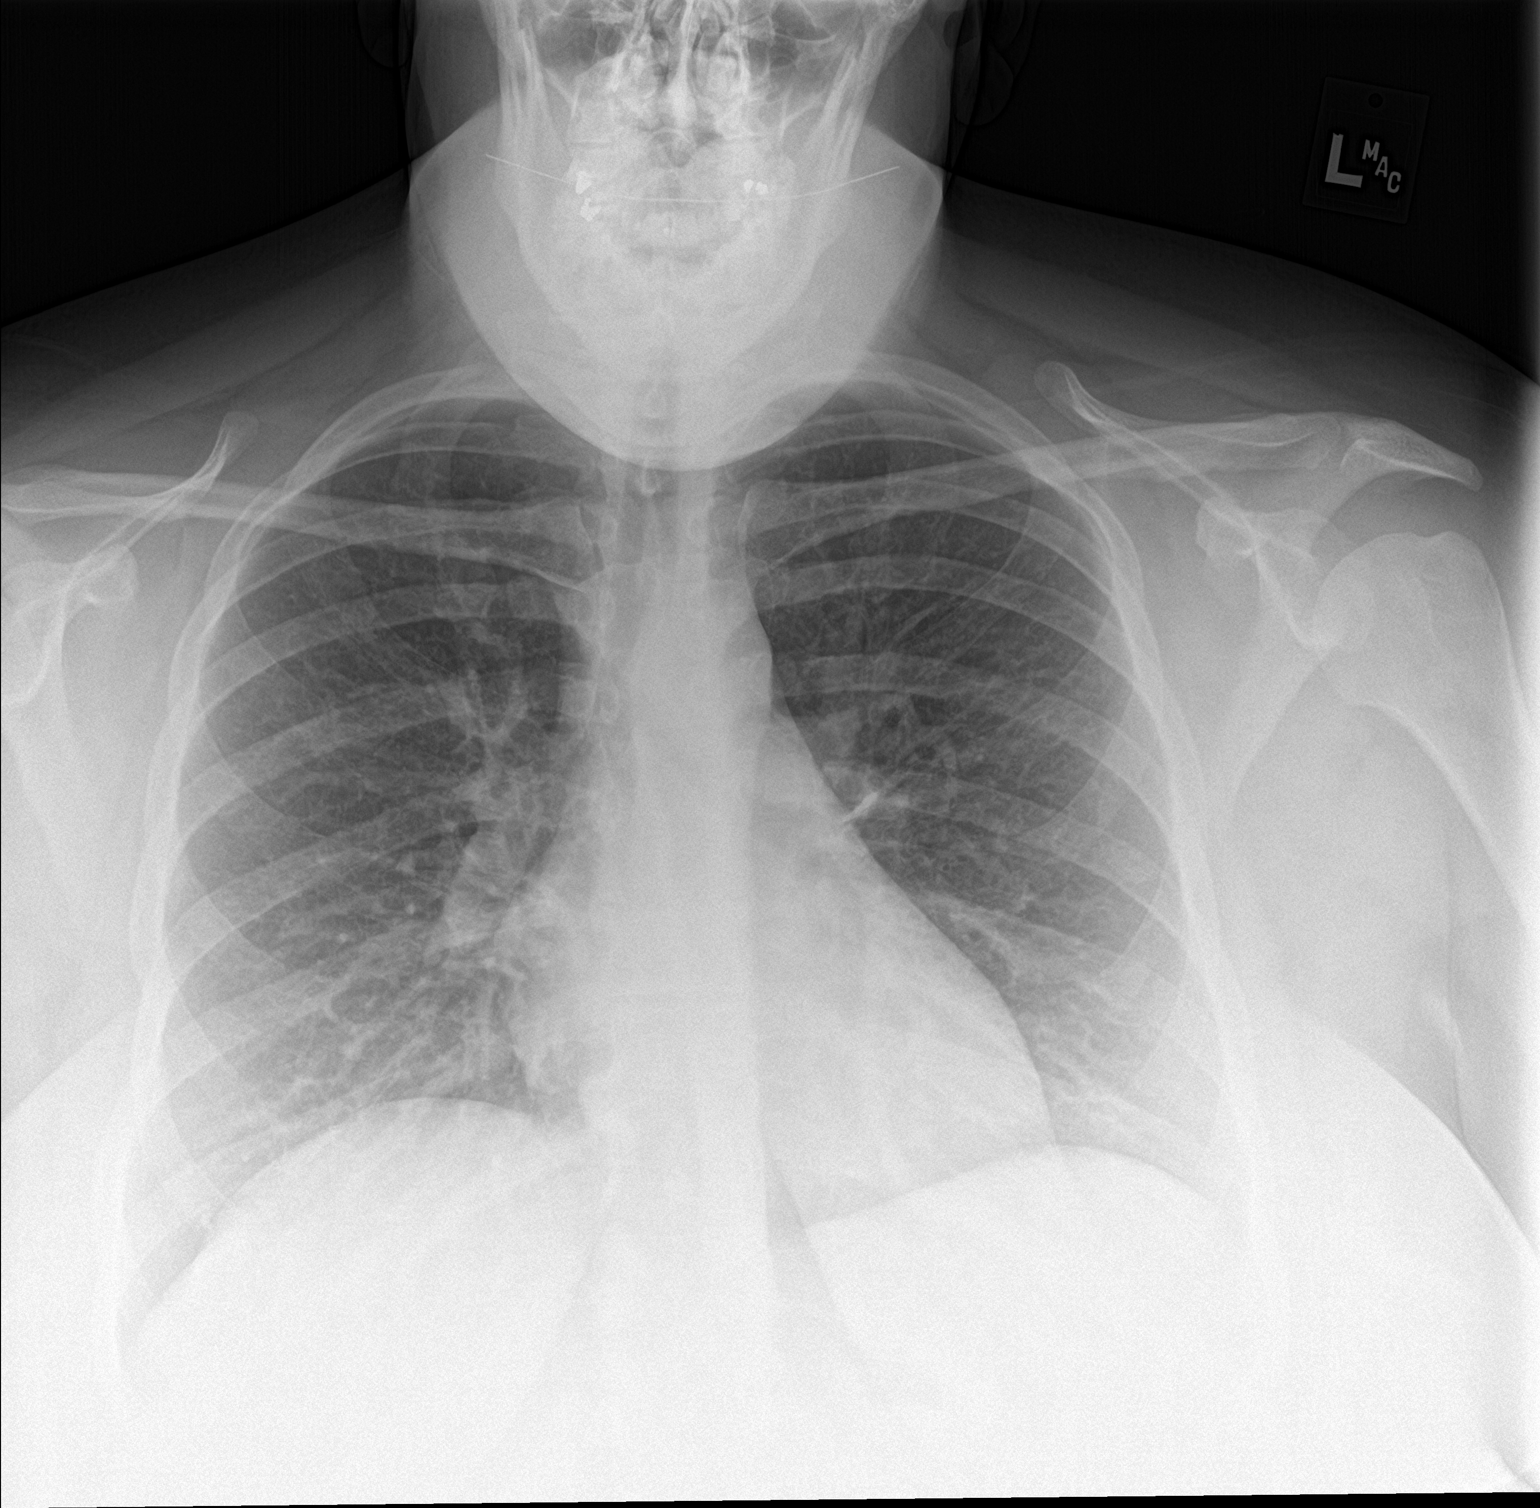

[chest lat]
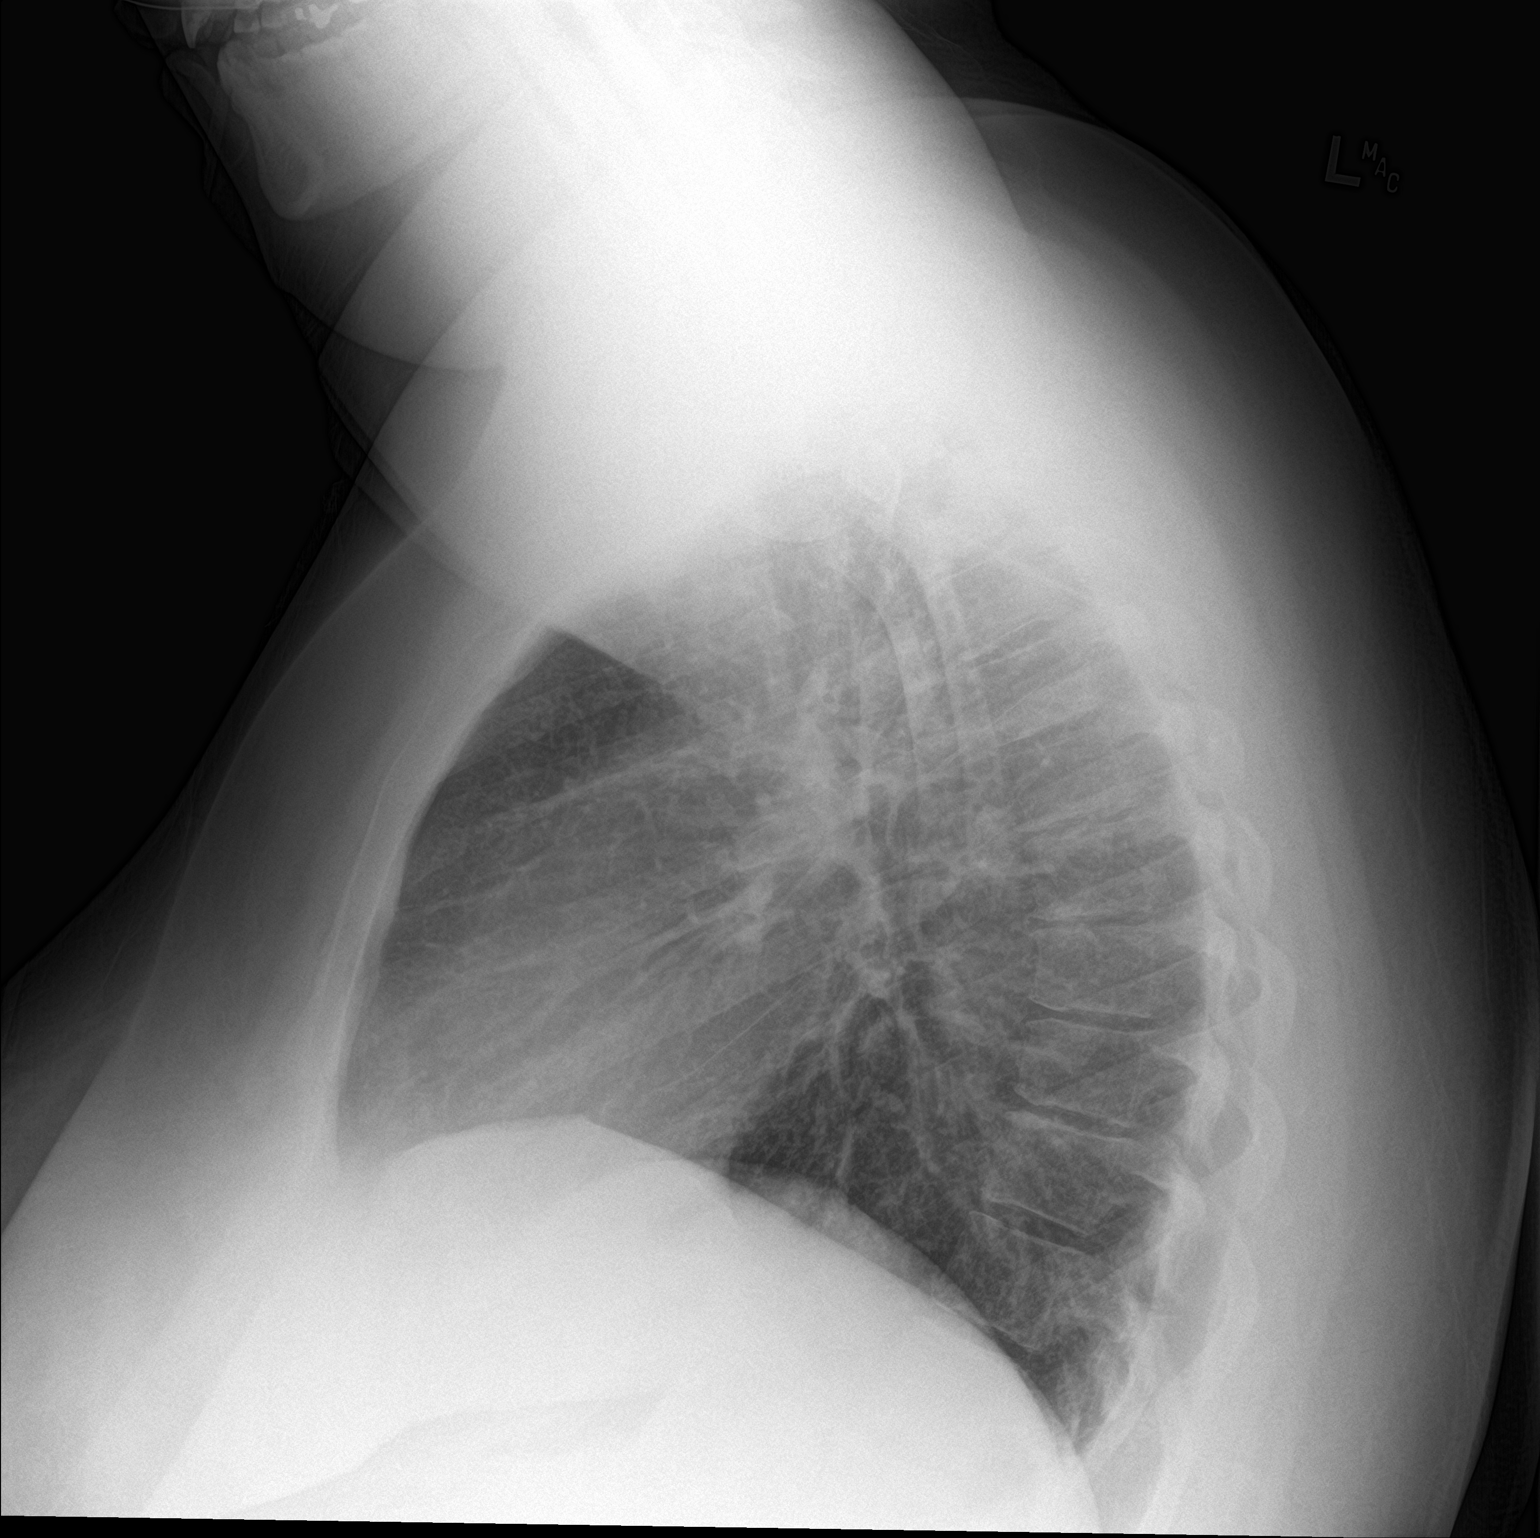

[2 of 2 positions shown; findings below may reference images not displayed]

FINDINGS: The lungs are well-aerated. Minimal bibasilar atelectasis is noted.
There is no evidence of pleural effusion or pneumothorax.

The heart is normal in size; the mediastinal contour is within
normal limits. No acute osseous abnormalities are seen.
IMPRESSION: Minimal bibasilar atelectasis noted; lungs otherwise clear.

## 2020-02-18 ENCOUNTER — Encounter (HOSPITAL_COMMUNITY): Payer: Self-pay

## 2020-02-18 ENCOUNTER — Emergency Department (HOSPITAL_COMMUNITY)
Admission: EM | Admit: 2020-02-18 | Discharge: 2020-02-18 | Disposition: A | Discharge status: Home / Self Care | Payer: Medicaid Other | Attending: Emergency Medicine | Admitting: Emergency Medicine

## 2020-02-18 ENCOUNTER — Other Ambulatory Visit: Payer: Self-pay

## 2020-02-18 DIAGNOSIS — Z79899 Other long term (current) drug therapy: Secondary | ICD-10-CM | POA: Diagnosis not present

## 2020-02-18 DIAGNOSIS — Z760 Encounter for issue of repeat prescription: Secondary | ICD-10-CM | POA: Insufficient documentation

## 2020-02-18 DIAGNOSIS — M549 Dorsalgia, unspecified: Secondary | ICD-10-CM | POA: Diagnosis present

## 2020-02-18 DIAGNOSIS — J45909 Unspecified asthma, uncomplicated: Secondary | ICD-10-CM | POA: Diagnosis not present

## 2020-02-18 DIAGNOSIS — G8929 Other chronic pain: Secondary | ICD-10-CM | POA: Diagnosis not present

## 2020-02-18 MED ORDER — TRAMADOL HCL 50 MG PO TABS: 50.0000 mg | ORAL_TABLET | Freq: Four times a day (QID) | ORAL | 0 refills | Status: DC | PRN

## 2020-02-18 MED ORDER — TRAMADOL HCL 50 MG PO TABS
50.0000 mg | ORAL_TABLET | Freq: Once | ORAL | Status: AC
  Administered 2020-02-18: 50 mg via ORAL
  Filled 2020-02-18: qty 1

## 2020-02-18 NOTE — Discharge Instructions (Addendum)
Follow-up with your doctor.  Further prescriptions for Ultram will not be given through the emergency room.

## 2020-02-18 NOTE — ED Triage Notes (Signed)
Pt stated she pulled a disc out at work 3 years ago. Pt has chronic back pain. Pt stated she is no longer on codeine and ran out of tramadol. Pt states she takes gabapentin and robaxin. Pt states back pain is 8/10.

## 2020-02-18 NOTE — ED Provider Notes (Signed)
[facility] DEPT Provider Note   CSN: [id number] Arrival date & time: 02/18/20  2108     History Chief Complaint  Patient presents with  . Back Pain    Vanessa Nolan is a 34 y.o. female.  34 year old female presents with acute on chronic low back pain.  Patient reports history of herniated disks, states that she was previously prescribed Tylenol No. 3 for this and then changed doctors and her current doctor prescribes Ultram.  Patient reports running out of her Ultram, is due for a refill of her medication and has been in touch with her doctor's office but medication has not been refilled as of yet.  Patient denies any changes in her chronic baseline pain, no new injuries.  Denies loss of bowel or bladder control, leg weakness or other complaints or concerns tonight.  Patient is taking gabapentin and NSAIDs without any relief of her pain and states his medications just make her sleepy.        Past Medical History:  Diagnosis Date  . Asthma   . Depression   . Headache    Migraines  . Herniated disc, cervical   . Pregnancy induced hypertension 2015  . Preterm labor   . Ulcerative colitis [facility])     Patient Active Problem List   Diagnosis Date Noted  . Post term pregnancy at [redacted] weeks gestation 04/15/2019  . Excessive weight gain in pregnancy 04/10/2019  . Abnormal glucose affecting pregnancy 02/13/2019  . Obesity in pregnancy 02/06/2019  . BMI 45.0-49.9, adult ([place]) 02/06/2019  . Chronic prescription opiate use 02/06/2019  . Late prenatal care 02/06/2019  . History of preterm delivery 02/06/2019  . Fetal 2cm subdiaphragmatic mass on ultrasound 01/25/2019  . Supervision of high risk pregnancy, antepartum 01/20/2019  . Ulcerative colitis ([place]) 01/20/2019  . History of pre-eclampsia in prior pregnancy, currently pregnant 01/20/2019  . Chronic back pain 01/20/2019  . History of depression 01/20/2019  . Hx of migraine headaches 01/20/2019     Past Surgical History:  Procedure Laterality Date  . CHOLECYSTECTOMY    . COLONOSCOPY    . KNEE SURGERY    . WISDOM TOOTH EXTRACTION       OB History    Gravida  6   Para  5   Term  4   Preterm  1   AB  1   Living  5     SAB  1   TAB      Ectopic      Multiple  0   Live Births  5           Family History  Problem Relation Age of Onset  . Diabetes Father   . Cancer Brother        testicular  . Cancer Paternal Grandfather     Social History   Tobacco Use  . Smoking status: Never Smoker  . Smokeless tobacco: Never Used  Substance Use Topics  . Alcohol use: Not Currently  . Drug use: Never    Home Medications Prior to Admission medications   Medication Sig Start Date End Date Taking? Authorizing Provider  acetaminophen (TYLENOL) 500 MG tablet Take 1,000 mg by mouth every 6 (six) hours as needed for mild pain or headache.     [provider]  acetaminophen-codeine (TYLENOL #3) 300-30 MG tablet Take 1-2 tablets by mouth every 8 (eight) hours as needed for moderate pain or severe pain. 10/16/19   Wieters, Elesa Hacker, PA-C  albuterol (VENTOLIN HFA) 108 (90 Base) MCG/ACT inhaler Inhale 2 puffs into the lungs every 6 (six) hours as needed for wheezing or shortness of breath.    [provider]  clindamycin (CLEOCIN) 300 MG capsule Take 1 capsule (300 mg total) by mouth 3 (three) times daily. 11/14/19   Wallis Bamberg, PA-C  gabapentin (NEURONTIN) 800 MG tablet Take 800 mg by mouth 3 (three) times daily.    [provider]  ibuprofen (ADVIL) 600 MG tablet Take 1 tablet (600 mg total) by mouth every 6 (six) hours. 04/18/19   Katrinka Blazing, [place], CNM  meloxicam (MOBIC) 15 MG tablet Take 1 tablet (15 mg total) by mouth daily. 11/14/19   Wallis Bamberg, PA-C  mesalamine (LIALDA) 1.2 g EC tablet Take 2 tablets by mouth twice daily 09/12/19   Meryl Dare, MD  methocarbamol (ROBAXIN) 750 MG tablet Take 750 mg by mouth 4 (four) times daily.     [provider]  nystatin-triamcinolone (MYCOLOG II) cream Apply 1 application topically 3 (three) times daily. 05/30/19   Armando Reichert, CNM  omeprazole (PRILOSEC) 20 MG capsule Take 2 capsules (40 mg total) by mouth daily. 03/20/19   Levie Heritage, DO  Prenatal Vit-Fe Fumarate-FA (PREPLUS) 27-1 MG TABS Take 1 tablet by mouth daily. 02/06/19   Anthony Bing, MD  traMADol (ULTRAM) 50 MG tablet Take 1 tablet (50 mg total) by mouth every 6 (six) hours as needed for severe pain. 02/18/20   Jeannie Fend, PA-C  hydrochlorothiazide (HYDRODIURIL) 25 MG tablet Take 1 tablet (25 mg total) by mouth daily. 04/18/19 10/16/19  Katrinka Blazing, [place], CNM  metoCLOPramide (REGLAN) 10 MG tablet Take 10 mg by mouth daily as needed (For migrane.).  10/16/19  [provider]  norethindrone (MICRONOR) 0.35 MG tablet Take 1 tablet (0.35 mg total) by mouth daily. 05/14/19 10/16/19  Katrinka Blazing, [place], CNM    Allergies    Morphine and related  Review of Systems   Review of Systems  Constitutional: Negative for fever.  Gastrointestinal: Negative for abdominal pain, constipation, diarrhea, nausea and vomiting.  Genitourinary: Negative for decreased urine volume and difficulty urinating.  Musculoskeletal: Positive for back pain. Negative for gait problem.  Skin: Negative for rash and wound.  Allergic/Immunologic: Negative for immunocompromised state.  Neurological: Negative for weakness and numbness.  All other systems reviewed and are negative.   Physical Exam Updated Vital Signs BP 137/90 (BP Location: Right Arm)   Pulse 78   Temp 98.2 F (36.8 C) (Oral)   Resp 16   SpO2 100%   Physical Exam Vitals and nursing note reviewed.  Constitutional:      General: She is not in acute distress.    Appearance: She is well-developed. She is obese. She is not diaphoretic.  HENT:     Head: Normocephalic and atraumatic.  Cardiovascular:     Pulses: Normal pulses.  Pulmonary:     Effort: Pulmonary  effort is normal.  Musculoskeletal:        General: Tenderness present. No swelling or deformity.     Comments: Generalized/diffuse back pain without specific point tenderness identified.  Skin:    General: Skin is warm and dry.     Findings: No erythema or rash.  Neurological:     Mental Status: She is alert and oriented to person, place, and time.     Gait: Gait abnormal.  Psychiatric:        Behavior: Behavior normal.     ED Results / Procedures /  Treatments   Labs (all labs ordered are listed, but only abnormal results are displayed) Labs Reviewed - No data to display  EKG None  Radiology No results found.  Procedures Procedures (including critical care time)  Medications Ordered in ED Medications  traMADol (ULTRAM) tablet 50 mg (50 mg Oral Given 02/18/20 2307)    ED Course  I have reviewed the triage vital signs and the nursing notes.  Pertinent labs & imaging results that were available during my care of the patient were reviewed by me and considered in my medical decision making (see chart for details).  Clinical Course as of Feb 18 2308  Sun Feb 18, 2020  4823 34 year old female presents with request for refill of her Ultram.  Patient states she takes his medication regularly, database shows she last filled this prescription on February 22.  Patient's been in touch with her PCP office however refill has not been called in as of yet.  No changes in her chronic baseline pain, no new injuries, no red flag symptoms.  Patient will be given 1 dose of Ultram here tonight with prescription for 8 tablets and advised that this medication is not likely to be refilled in the emergency room again and she will need to get further prescriptions from her provider.   [LM]    Clinical Course User Index [LM] Alden Hipp   MDM Rules/Calculators/A&P                      Final Clinical Impression(s) / ED Diagnoses Final diagnoses:  Chronic back pain, unspecified back  location, unspecified back pain laterality    Rx / DC Orders ED Discharge Orders         Ordered    traMADol (ULTRAM) 50 MG tablet  Every 6 hours PRN     02/18/20 2300           Jeannie Fend, PA-C 02/18/20 2309    Pricilla Loveless, MD 02/20/20 [phone]

## 2020-03-21 IMAGING — US US MFM OB DETAIL+14 WK
1 series · 12 of 28 positions shown · non-contrast
Comparison: none

[Series 1: us mfm ob detail+14 wk · 12 of 78 slices shown]
[im 3/78]
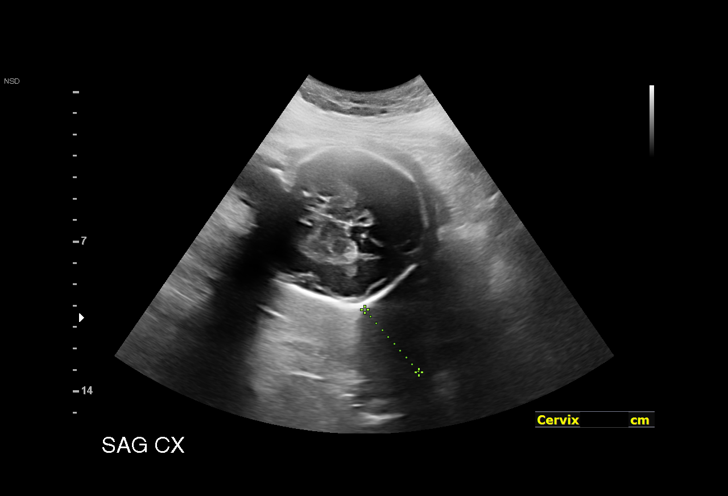
[im 9/78]
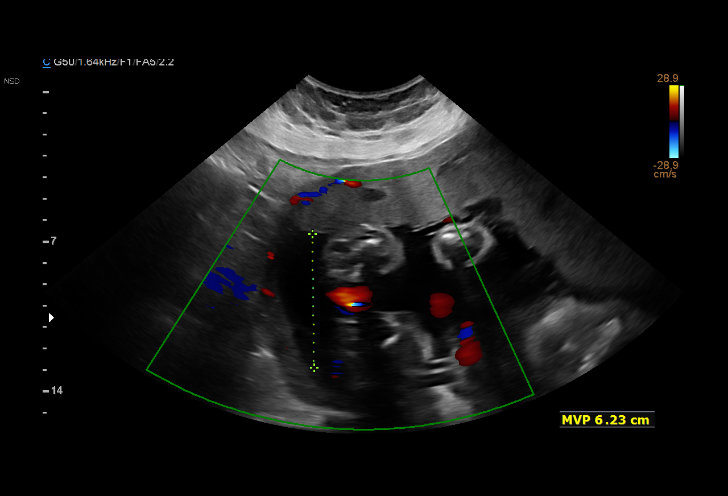
[im 15/78]
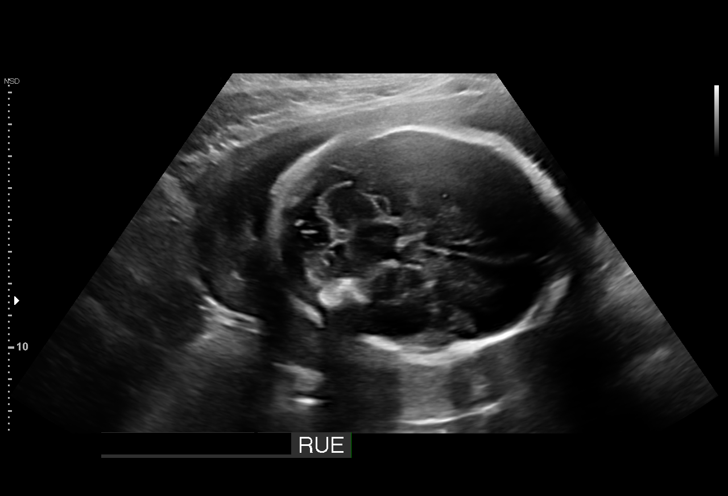
[im 23/78]
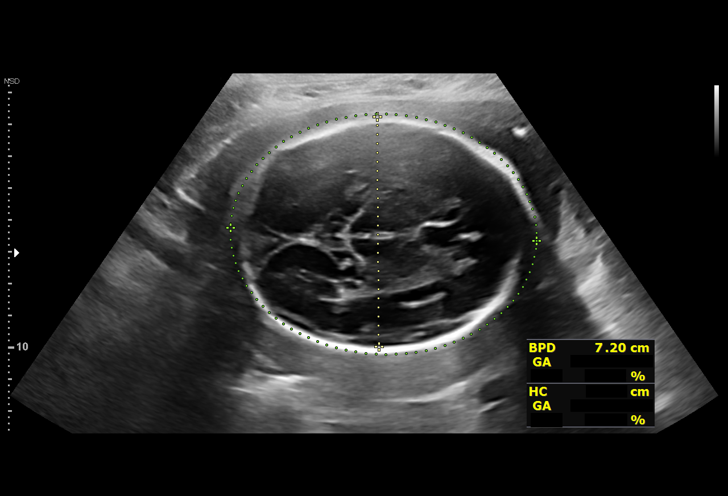
[im 29/78]
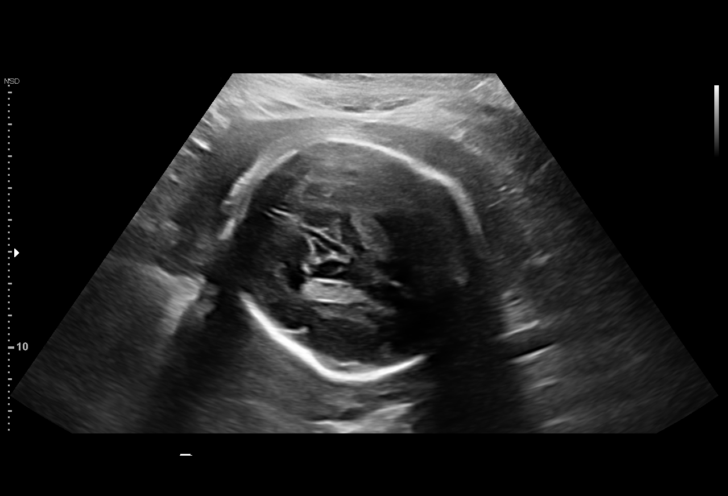
[im 35/78]
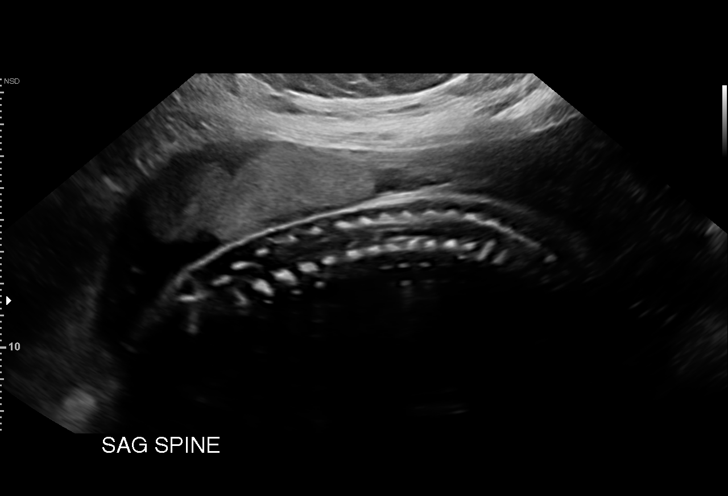
[im 43/78]
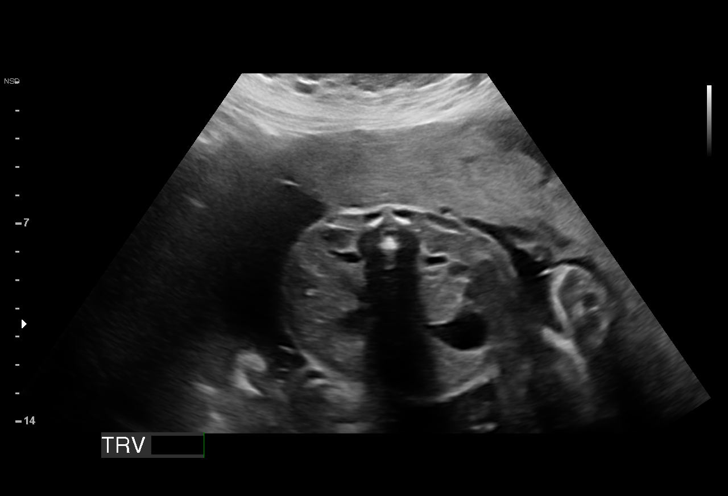
[im 49/78]
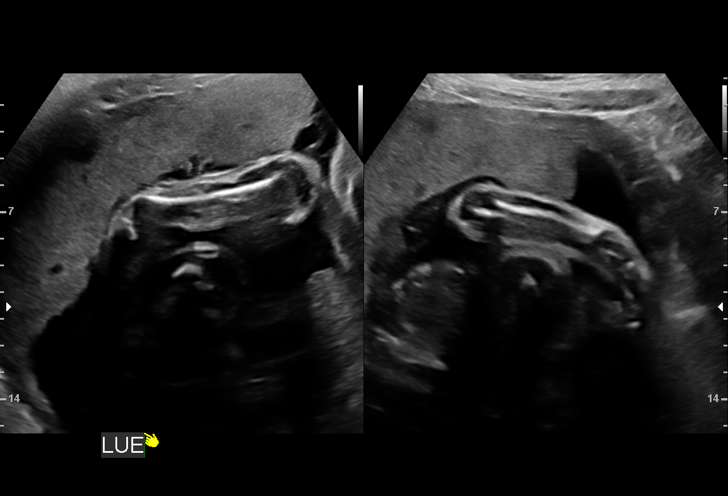
[im 55/78]
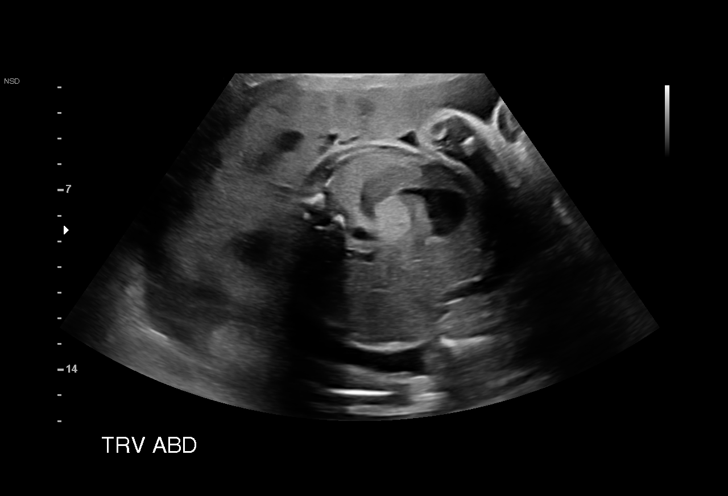
[im 63/78]
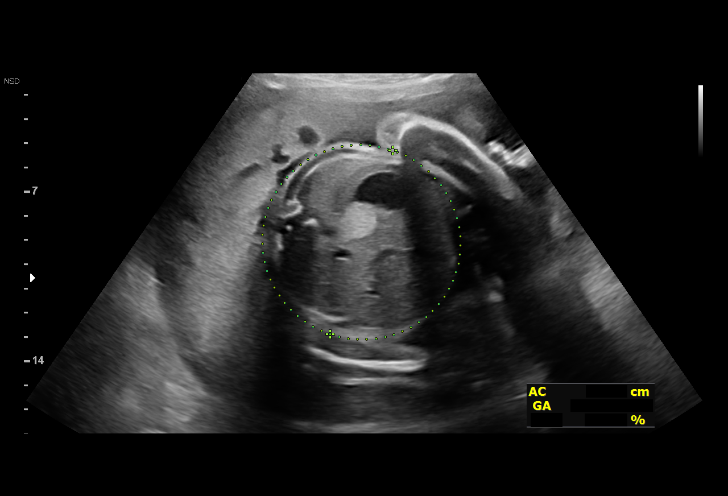
[im 69/78]
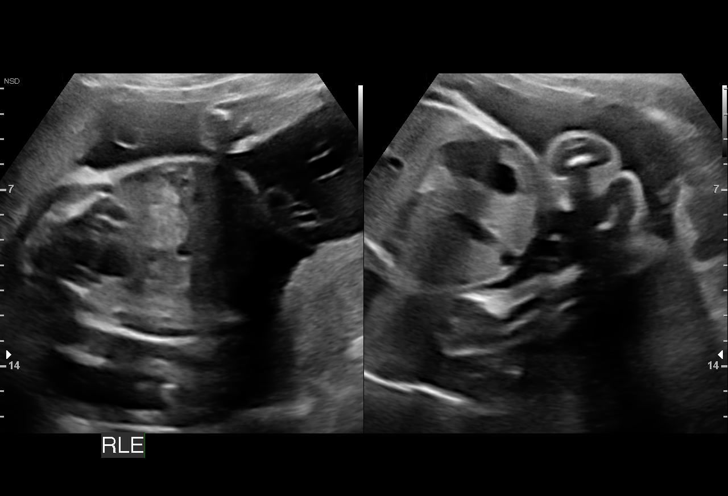
[im 75/78]
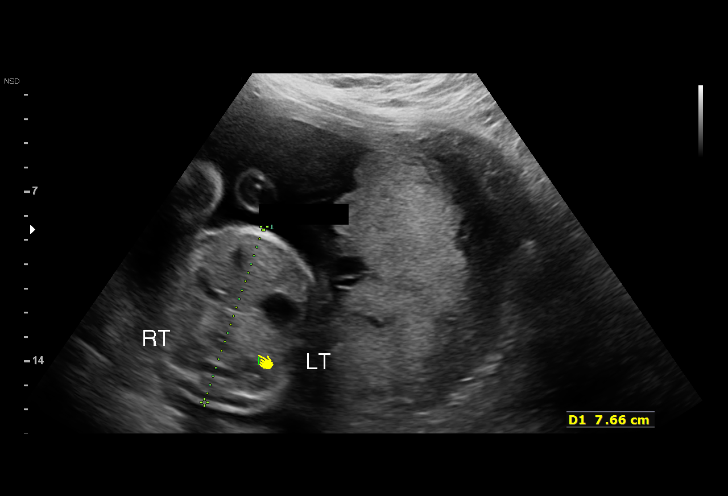

[12 of 28 positions shown; findings below may reference images not displayed]

OB/Gyn Clinic

 ----------------------------------------------------------------------

 ----------------------------------------------------------------------
Indications

  29 weeks gestation of pregnancy
  Encounter for antenatal screening for
  malformations
  Poor obstetric history: Previous
  preeclampsia / eclampsia/gestational HTN
  (Induced at 36 weeks)
  Medical complication of pregnancy
  (Ulcerative Colitis-currently taking Lialda)
  Poor obstetric history: Previous preterm
  delivery, antepartum (Pre-E)
 ----------------------------------------------------------------------
Fetal Evaluation

 Num Of Fetuses:         1
 Fetal Heart Rate(bpm):  164
 Cardiac Activity:       Observed
 Presentation:           Cephalic
 Placenta:               Anterior
 P. Cord Insertion:      Visualized, central

 Amniotic Fluid
 AFI FV:      Within normal limits

                             Largest Pocket(cm)

Biometry
 BPD:        72  mm     G. Age:  28w 6d         19  %    CI:        73.14   %    70 - 86
                                                         FL/HC:      20.3   %    19.2 -
 HC:      267.6  mm     G. Age:  29w 1d         10  %    HC/AC:      0.98        0.99 -
 AC:      272.8  mm     G. Age:  31w 3d         90  %    FL/BPD:     75.4   %    71 - 87
 FL:       54.3  mm     G. Age:  28w 5d         16  %    FL/AC:      19.9   %    20 - 24
 HUM:      46.9  mm     G. Age:  27w 4d          9  %
 CER:      34.9  mm     G. Age:  30w 0d         59  %

 LV:        5.2  mm
 CM:        6.4  mm

 Est. FW:    2020  gm      3 lb 5 oz     63  %
OB History

 Gravidity:    6         Term:   3        Prem:   1        SAB:   1
 TOP:          0       Ectopic:  0        Living: 4
Gestational Age

 LMP:           27w 4d        Date:  07/16/18                 EDD:   04/22/19
 U/S Today:     29w 4d                                        EDD:   04/08/19
 Best:          29w 4d     Det. By:  U/S (01/25/19)           EDD:   04/08/19
Anatomy

 Cranium:               Appears normal         LVOT:                   Not well visualized
 Cavum:                 Appears normal         Aortic Arch:            Appears normal
 Ventricles:            Appears normal         Ductal Arch:            Not well visualized
 Choroid Plexus:        Appears normal         Diaphragm:              Appears normal
 Cerebellum:            Appears normal         Stomach:                Appears normal, left
                                                                       sided
 Posterior Fossa:       Appears normal         Abdomen:                Abnormal, see
                                                                       comments
 Nuchal Fold:           Not applicable (>20    Abdominal Wall:         Not well visualized
                        wks GA)
 Face:                  Orbits appear          Cord Vessels:           Not well visualized
                        normal
 Lips:                  Appears normal         Kidneys:                Appear normal
 Palate:                Not well visualized    Bladder:                Appears normal
 Thoracic:              Appears normal         Spine:                  Limited views
                                                                       appear normal
 Heart:                 Not well visualized    Upper Extremities:      Present
 RVOT:                  Not well visualized    Lower Extremities:      Present

 Other:  Parents do not wish to know sex of fetus. Technically difficult due to
         maternal habitus and fetal position.
Cervix Uterus Adnexa

 Cervix
 Length:           3.86  cm.
 Normal appearance by transabdominal scan.

 Left Ovary
 Within normal limits.

 Right Ovary
 Within normal limits.
 Adnexa
 No abnormality visualized.
Impression

 Ms. Sms, Akra6 P4 at unknown gestational age is here for
 her first ultrasound in this pregnancy. She recently relocated
 here from [REDACTED], [place].
 Obstetric history is significant for 4 previous vaginal deliveries.

 On ultrasound, composite fetal biometry is consistent with
 29w 4d gestation. We assigned her EDD at 04/08/2019.

 Amniotic fluid is normal and good fetal activity is seen. An
 echogenic subdiaphragmatic mass close to the stomach on
 the left side, measuring 2.2 x 1.7 cm, is seen. No color-flow in
 the mass is seen. I cannot identify any blood supply from
 descending aorta to the mass. No evidence of fetal ascites or
 pleural or pericardial effusions. Kidneys look normal.
 Rest of the fetal anatomy appears normal. Cardiac views
 were suboptimal because of fetal position.

 I explained the findings and my suspicion is that it is
 consistent with extralobar pulmonary sequestration. In about
 15% of cases, it can be subdiaphragmatic.
 Differential diagnosis is liver mass.

 I explained that in the absence of other anomalies, isolated
 pulmonary sequestration carries a good prognosis. We will
 monitor the fetus every 2 weeks for development of hydrops.

 I discussed MRI that can be useful in identifying the origin of
 the mass. We will rescan the patient in 2 weeks and
 recommend MRI if necessary.
Recommendations

 -An appointment was made for her to return in 2 weeks for
 evaluation of abdominal mass.
                 Fee, [place]

## 2020-04-14 ENCOUNTER — Other Ambulatory Visit: Payer: Self-pay

## 2020-04-14 ENCOUNTER — Encounter (HOSPITAL_COMMUNITY): Payer: Self-pay | Admitting: Emergency Medicine

## 2020-04-14 DIAGNOSIS — J029 Acute pharyngitis, unspecified: Secondary | ICD-10-CM | POA: Diagnosis present

## 2020-04-14 DIAGNOSIS — Z79899 Other long term (current) drug therapy: Secondary | ICD-10-CM | POA: Insufficient documentation

## 2020-04-14 NOTE — ED Triage Notes (Signed)
Pt reports sore throat for the last 5 days. Pt has hx of strep throat.

## 2020-04-15 ENCOUNTER — Emergency Department (HOSPITAL_COMMUNITY)
Admission: EM | Admit: 2020-04-15 | Discharge: 2020-04-15 | Disposition: A | Discharge status: Home / Self Care | Payer: Medicaid Other | Attending: Emergency Medicine | Admitting: Emergency Medicine

## 2020-04-15 DIAGNOSIS — J029 Acute pharyngitis, unspecified: Secondary | ICD-10-CM

## 2020-04-15 LAB — GROUP A STREP BY PCR: Group A Strep by PCR: NOT DETECTED

## 2020-04-15 LAB — MONONUCLEOSIS SCREEN: Mono Screen: NEGATIVE

## 2020-04-15 MED ORDER — CETIRIZINE HCL 10 MG PO TABS: 10.0000 mg | ORAL_TABLET | Freq: Every day | ORAL | 0 refills | Status: DC

## 2020-04-15 NOTE — ED Provider Notes (Signed)
[facility] DEPT Provider Note   CSN: [id number] Arrival date & time: 04/14/20  2247     History Chief Complaint  Patient presents with  . Sore Throat    Vanessa Nolan is a 34 y.o. female.  Patient presents to the emergency department with a chief complaint of sore throat.  She reports having sore throat for the past 4 to 5 days.  She states that her children were sick with sore throat last week.  She denies having any fever, chills, or cough.  She denies any known exposures to Covid 19.  She states that her son's birthday is coming up and she wanted to ensure that she did not have strep throat or was contagious.  She denies any treatments prior to arrival.  The history is provided by the patient. No language interpreter was used.       Past Medical History:  Diagnosis Date  . Asthma   . Depression   . Headache    Migraines  . Herniated disc, cervical   . Pregnancy induced hypertension 2015  . Preterm labor   . Ulcerative colitis [facility])     Patient Active Problem List   Diagnosis Date Noted  . Post term pregnancy at [redacted] weeks gestation 04/15/2019  . Excessive weight gain in pregnancy 04/10/2019  . Abnormal glucose affecting pregnancy 02/13/2019  . Obesity in pregnancy 02/06/2019  . BMI 45.0-49.9, adult ([place]) 02/06/2019  . Chronic prescription opiate use 02/06/2019  . Late prenatal care 02/06/2019  . History of preterm delivery 02/06/2019  . Fetal 2cm subdiaphragmatic mass on ultrasound 01/25/2019  . Supervision of high risk pregnancy, antepartum 01/20/2019  . Ulcerative colitis ([place]) 01/20/2019  . History of pre-eclampsia in prior pregnancy, currently pregnant 01/20/2019  . Chronic back pain 01/20/2019  . History of depression 01/20/2019  . Hx of migraine headaches 01/20/2019    Past Surgical History:  Procedure Laterality Date  . CHOLECYSTECTOMY    . COLONOSCOPY    . KNEE SURGERY    . WISDOM TOOTH EXTRACTION       OB History      Gravida  6   Para  5   Term  4   Preterm  1   AB  1   Living  5     SAB  1   TAB      Ectopic      Multiple  0   Live Births  5           Family History  Problem Relation Age of Onset  . Diabetes Father   . Cancer Brother        testicular  . Cancer Paternal Grandfather     Social History   Tobacco Use  . Smoking status: Never Smoker  . Smokeless tobacco: Never Used  Substance Use Topics  . Alcohol use: Not Currently  . Drug use: Never    Home Medications Prior to Admission medications   Medication Sig Start Date End Date Taking? Authorizing Provider  acetaminophen (TYLENOL) 500 MG tablet Take 1,000 mg by mouth every 6 (six) hours as needed for mild pain or headache.     [provider]  acetaminophen-codeine (TYLENOL #3) 300-30 MG tablet Take 1-2 tablets by mouth every 8 (eight) hours as needed for moderate pain or severe pain. 10/16/19   Wieters, Hallie C, PA-C  albuterol (VENTOLIN HFA) 108 (90 Base) MCG/ACT inhaler Inhale 2 puffs into the lungs every 6 (six) hours as  needed for wheezing or shortness of breath.    [provider]  cetirizine (ZYRTEC ALLERGY) 10 MG tablet Take 1 tablet (10 mg total) by mouth daily. 04/15/20   Montine Circle, PA-C  clindamycin (CLEOCIN) 300 MG capsule Take 1 capsule (300 mg total) by mouth 3 (three) times daily. 11/14/19   Jaynee Eagles, PA-C  gabapentin (NEURONTIN) 800 MG tablet Take 800 mg by mouth 3 (three) times daily.    [provider]  ibuprofen (ADVIL) 600 MG tablet Take 1 tablet (600 mg total) by mouth every 6 (six) hours. 04/18/19   Tamala Julian, [place], CNM  meloxicam (MOBIC) 15 MG tablet Take 1 tablet (15 mg total) by mouth daily. 11/14/19   Jaynee Eagles, PA-C  mesalamine (LIALDA) 1.2 g EC tablet Take 2 tablets by mouth twice daily 09/12/19   Ladene Artist, MD  methocarbamol (ROBAXIN) 750 MG tablet Take 750 mg by mouth 4 (four) times daily.    [provider]   nystatin-triamcinolone (MYCOLOG II) cream Apply 1 application topically 3 (three) times daily. 05/30/19   Tresea Mall, CNM  omeprazole (PRILOSEC) 20 MG capsule Take 2 capsules (40 mg total) by mouth daily. 03/20/19   Truett Mainland, DO  Prenatal Vit-Fe Fumarate-FA (PREPLUS) 27-1 MG TABS Take 1 tablet by mouth daily. 02/06/19   Aletha Halim, MD  traMADol (ULTRAM) 50 MG tablet Take 1 tablet (50 mg total) by mouth every 6 (six) hours as needed for severe pain. 02/18/20   Tacy Learn, PA-C  hydrochlorothiazide (HYDRODIURIL) 25 MG tablet Take 1 tablet (25 mg total) by mouth daily. 04/18/19 10/16/19  Tamala Julian, [place], CNM  metoCLOPramide (REGLAN) 10 MG tablet Take 10 mg by mouth daily as needed (For migrane.).  10/16/19  [provider]  norethindrone (MICRONOR) 0.35 MG tablet Take 1 tablet (0.35 mg total) by mouth daily. 05/14/19 10/16/19  Tamala Julian, [place], CNM    Allergies    Morphine and related  Review of Systems   Review of Systems  All other systems reviewed and are negative.   Physical Exam Updated Vital Signs BP 127/84 (BP Location: Right Arm)   Pulse 85   Temp 98 F (36.7 C) (Oral)   Resp 20   Ht 5' 6.5" (1.689 m)   Wt 136.1 kg   LMP 04/02/2020   SpO2 97%   BMI 47.70 kg/m   Physical Exam Vitals and nursing note reviewed.  Constitutional:      General: She is not in acute distress.    Appearance: She is well-developed.  HENT:     Head: Normocephalic and atraumatic.     Mouth/Throat:     Comments: Oropharynx is mildly erythematous, no tonsillar exudate, no peritonsillar abscess, no stridor, normal phonation Eyes:     Conjunctiva/sclera: Conjunctivae normal.  Cardiovascular:     Rate and Rhythm: Normal rate and regular rhythm.     Heart sounds: No murmur.  Pulmonary:     Effort: Pulmonary effort is normal. No respiratory distress.     Breath sounds: Normal breath sounds.  Abdominal:     Palpations: Abdomen is soft.     Tenderness: There is no  abdominal tenderness.  Musculoskeletal:     Cervical back: Neck supple.  Skin:    General: Skin is warm and dry.  Neurological:     Mental Status: She is alert and oriented to person, place, and time.  Psychiatric:        Behavior: Behavior normal.     ED Results /  Procedures / Treatments   Labs (all labs ordered are listed, but only abnormal results are displayed) Labs Reviewed  GROUP A STREP BY PCR  MONONUCLEOSIS SCREEN    EKG None  Radiology No results found.  Procedures Procedures (including critical care time)  Medications Ordered in ED Medications - No data to display  ED Course  I have reviewed the triage vital signs and the nursing notes.  Pertinent labs & imaging results that were available during my care of the patient were reviewed by me and considered in my medical decision making (see chart for details).    MDM Rules/Calculators/A&P                       Pt afebrile without tonsillar exudate, negative strep. Presents with mild cervical lymphadenopathy, & dysphagia; diagnosis of viral pharyngitis. No abx indicated. DC w symptomatic tx for pain  Pt does not appear dehydrated, but did discuss importance of water rehydration. Presentation non concerning for PTA or infxn spread to soft tissue. No trismus or uvula deviation. Specific return precautions discussed. Pt able to drink water in ED without difficulty with intact air way. Recommended PCP follow up.   Final Clinical Impression(s) / ED Diagnoses Final diagnoses:  Pharyngitis, unspecified etiology    Rx / DC Orders ED Discharge Orders         Ordered    cetirizine (ZYRTEC ALLERGY) 10 MG tablet  Daily     04/15/20 0150           Montine Circle, PA-C 04/15/20 0152    Orpah Greek, MD 04/15/20 0157

## 2020-11-05 ENCOUNTER — Encounter (HOSPITAL_COMMUNITY): Payer: Self-pay | Admitting: Emergency Medicine

## 2020-11-05 ENCOUNTER — Other Ambulatory Visit: Payer: Self-pay

## 2020-11-05 ENCOUNTER — Ambulatory Visit (HOSPITAL_COMMUNITY)
Admission: EM | Admit: 2020-11-05 | Discharge: 2020-11-05 | Disposition: A | Discharge status: Home / Self Care | Payer: Medicaid Other | Attending: Emergency Medicine | Admitting: Emergency Medicine

## 2020-11-05 DIAGNOSIS — Z20822 Contact with and (suspected) exposure to covid-19: Secondary | ICD-10-CM | POA: Insufficient documentation

## 2020-11-05 DIAGNOSIS — J069 Acute upper respiratory infection, unspecified: Secondary | ICD-10-CM | POA: Insufficient documentation

## 2020-11-05 NOTE — Discharge Instructions (Signed)
Throat lozenges, gargles, chloraseptic spray, warm teas, popsicles etc to help with throat pain.   Push fluids to ensure adequate hydration and keep secretions thin.  Tylenol and/or ibuprofen as needed for pain or fevers.  Self isolate until covid results are back and negative.  Will notify you by phone of any positive findings. Your negative results will be sent through your MyChart.     If symptoms worsen or do not improve in the next week to return to be seen or to follow up with your PCP.

## 2020-11-05 NOTE — ED Triage Notes (Signed)
Sore throat, cold sore, runny nose, slight cough.  No known fever

## 2020-11-05 NOTE — ED Notes (Signed)
Pt called x 2 in front lobby , no answer

## 2020-11-05 NOTE — ED Provider Notes (Signed)
[facility]    CSN: [id number] Arrival date & time: 11/05/20  1348      History   Chief Complaint Chief Complaint  Patient presents with  . Sore Throat    HPI Sanna Porcaro is a 34 y.o. female.   Emiley Digiacomo presents with complaints of sore throat, slight congestion and headache. Started two days ago and worsened yesterday. Felt hot but didn't have a thermometer, no rigors. No shortness of breath . No ear pain. 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 She has been vaccinated for covid-19. She took ibuprofen and it did seem to help.    ROS per HPI, negative if not otherwise mentioned.      Past Medical History:  Diagnosis Date  . Asthma   . Depression   . Headache    Migraines  . Herniated disc, cervical   . Pregnancy induced hypertension 2015  . Preterm labor   . Ulcerative colitis [facility])     Patient Active Problem List   Diagnosis Date Noted  . Post term pregnancy at [redacted] weeks gestation 04/15/2019  . Excessive weight gain in pregnancy 04/10/2019  . Abnormal glucose affecting pregnancy 02/13/2019  . Obesity in pregnancy 02/06/2019  . BMI 45.0-49.9, adult ([place]) 02/06/2019  . Chronic prescription opiate use 02/06/2019  . Late prenatal care 02/06/2019  . History of preterm delivery 02/06/2019  . Fetal 2cm subdiaphragmatic mass on ultrasound 01/25/2019  . Supervision of high risk pregnancy, antepartum 01/20/2019  . Ulcerative colitis ([place]) 01/20/2019  . History of pre-eclampsia in prior pregnancy, currently pregnant 01/20/2019  . Chronic back pain 01/20/2019  . History of depression 01/20/2019  . Hx of migraine headaches 01/20/2019    Past Surgical History:  Procedure Laterality Date  . CHOLECYSTECTOMY    . COLONOSCOPY    . KNEE SURGERY    . WISDOM TOOTH EXTRACTION      OB History    Gravida  6   Para  5   Term  4   Preterm  1   AB  1   Living  5     SAB  1   TAB       Ectopic      Multiple  0   Live Births  5            Home Medications    Prior to Admission medications   Medication Sig Start Date End Date Taking? Authorizing Provider  gabapentin (NEURONTIN) 800 MG tablet Take 800 mg by mouth 3 (three) times daily.   Yes [provider]  ibuprofen (ADVIL) 600 MG tablet Take 1 tablet (600 mg total) by mouth every 6 (six) hours. 04/18/19  Yes Smith, [place], CNM  acetaminophen (TYLENOL) 500 MG tablet Take 1,000 mg by mouth every 6 (six) hours as needed for mild pain or headache.     [provider]  acetaminophen-codeine (TYLENOL #3) 300-30 MG tablet Take 1-2 tablets by mouth every 8 (eight) hours as needed for moderate pain or severe pain. 10/16/19   Wieters, Hallie C, PA-C  albuterol (VENTOLIN HFA) 108 (90 Base) MCG/ACT inhaler Inhale 2 puffs into the lungs every 6 (six) hours as needed for wheezing or shortness of breath.    [provider]  cetirizine (ZYRTEC ALLERGY) 10 MG tablet Take 1 tablet (10 mg total) by mouth daily. 04/15/20   Montine Circle, PA-C  clindamycin (CLEOCIN) 300 MG capsule Take 1 capsule (  300 mg total) by mouth 3 (three) times daily. 11/14/19   Jaynee Eagles, PA-C  meloxicam (MOBIC) 15 MG tablet Take 1 tablet (15 mg total) by mouth daily. 11/14/19   Jaynee Eagles, PA-C  mesalamine (LIALDA) 1.2 g EC tablet Take 2 tablets by mouth twice daily 09/12/19   Ladene Artist, MD  methocarbamol (ROBAXIN) 750 MG tablet Take 750 mg by mouth 4 (four) times daily.    [provider]  nystatin-triamcinolone (MYCOLOG II) cream Apply 1 application topically 3 (three) times daily. 05/30/19   Tresea Mall, CNM  omeprazole (PRILOSEC) 20 MG capsule Take 2 capsules (40 mg total) by mouth daily. 03/20/19   Truett Mainland, DO  oxyCODONE (ROXICODONE) 15 MG immediate release tablet Take 15 mg by mouth 3 (three) times daily as needed. 10/23/20   [provider]  Prenatal Vit-Fe Fumarate-FA (PREPLUS) 27-1 MG  TABS Take 1 tablet by mouth daily. 02/06/19   Aletha Halim, MD  traMADol (ULTRAM) 50 MG tablet Take 1 tablet (50 mg total) by mouth every 6 (six) hours as needed for severe pain. 02/18/20   Tacy Learn, PA-C  hydrochlorothiazide (HYDRODIURIL) 25 MG tablet Take 1 tablet (25 mg total) by mouth daily. 04/18/19 10/16/19  Tamala Julian, [place], CNM  metoCLOPramide (REGLAN) 10 MG tablet Take 10 mg by mouth daily as needed (For migrane.).  10/16/19  [provider]  norethindrone (MICRONOR) 0.35 MG tablet Take 1 tablet (0.35 mg total) by mouth daily. 05/14/19 10/16/19  Manya Silvas, CNM    Family History Family History  Problem Relation Age of Onset  . Diabetes Father   . Cancer Brother        testicular  . Cancer Paternal Grandfather     Social History Social History   Tobacco Use  . Smoking status: Never Smoker  . Smokeless tobacco: Never Used  Vaping Use  . Vaping Use: Never used  Substance Use Topics  . Alcohol use: Not Currently  . Drug use: Never     Allergies   Morphine and related   Review of Systems Review of Systems   Physical Exam Triage Vital Signs ED Triage Vitals  Enc Vitals Group     BP 11/05/20 1552 126/83     Pulse Rate 11/05/20 1552 95     Resp 11/05/20 1552 (!) 22     Temp 11/05/20 1552 97.8 F (36.6 C)     Temp Source 11/05/20 1552 Oral     SpO2 11/05/20 1552 97 %     Weight --      Height --      Head Circumference --      Peak Flow --      Pain Score 11/05/20 1548 3     Pain Loc --      Pain Edu? --      Excl. in [place]? --    No data found.  Updated Vital Signs BP 126/83 (BP Location: Right Arm) Comment (BP Location): large cuff  Pulse 95   Temp 97.8 F (36.6 C) (Oral)   Resp (!) 22   LMP 10/29/2020   SpO2 97%   Visual Acuity Right Eye Distance:   Left Eye Distance:   Bilateral Distance:    Right Eye Near:   Left Eye Near:    Bilateral Near:     Physical Exam Constitutional:      General: She is not in acute  distress.    Appearance: She is well-developed.  HENT:  Mouth/Throat:     Mouth: Mucous membranes are moist.     Tonsils: No tonsillar exudate. 1+ on the right. 1+ on the left.  Cardiovascular:     Rate and Rhythm: Normal rate.  Pulmonary:     Effort: Pulmonary effort is normal.  Lymphadenopathy:     Cervical: No cervical adenopathy.  Skin:    General: Skin is warm and dry.  Neurological:     Mental Status: She is alert and oriented to person, place, and time.      UC Treatments / Results  Labs (all labs ordered are listed, but only abnormal results are displayed) Labs Reviewed  SARS CORONAVIRUS 2 (TAT 6-24 HRS)    EKG   Radiology No results found.  Procedures Procedures (including critical care time)  Medications Ordered in UC Medications - No data to display  Initial Impression / Assessment and Plan / UC Course  I have reviewed the triage vital signs and the nursing notes.  Pertinent labs & imaging results that were available during my care of the patient were reviewed by me and considered in my medical decision making (see chart for details).     Non toxic. Benign physical exam.  History and physical consistent with viral illness.  Supportive cares recommended. Covid testing pending and isolation instructions provided.  Return precautions provided. Patient verbalized understanding and agreeable to plan.   Final Clinical Impressions(s) / UC Diagnoses   Final diagnoses:  Upper respiratory tract infection, unspecified type     Discharge Instructions     Throat lozenges, gargles, chloraseptic spray, warm teas, popsicles etc to help with throat pain.   Push fluids to ensure adequate hydration and keep secretions thin.  Tylenol and/or ibuprofen as needed for pain or fevers.  Self isolate until covid results are back and negative.  Will notify you by phone of any positive findings. Your negative results will be sent through your MyChart.     If symptoms  worsen or do not improve in the next week to return to be seen or to follow up with your PCP.     ED Prescriptions    None     PDMP not reviewed this encounter.   Zigmund Gottron, NP 11/05/20 1635

## 2020-11-06 LAB — SARS CORONAVIRUS 2 (TAT 6-24 HRS): SARS Coronavirus 2: NEGATIVE

## 2020-11-08 ENCOUNTER — Other Ambulatory Visit: Payer: Self-pay

## 2020-11-08 ENCOUNTER — Ambulatory Visit
Admission: EM | Admit: 2020-11-08 | Discharge: 2020-11-08 | Disposition: A | Discharge status: Home / Self Care | Payer: Medicaid Other | Attending: Emergency Medicine | Admitting: Emergency Medicine

## 2020-11-08 DIAGNOSIS — J0110 Acute frontal sinusitis, unspecified: Secondary | ICD-10-CM | POA: Insufficient documentation

## 2020-11-08 LAB — POCT RAPID STREP A (OFFICE): Rapid Strep A Screen: NEGATIVE

## 2020-11-08 MED ORDER — DOXYCYCLINE HYCLATE 100 MG PO CAPS: 100.0000 mg | ORAL_CAPSULE | Freq: Two times a day (BID) | ORAL | 0 refills | Status: AC

## 2020-11-08 MED ORDER — BENZONATATE 200 MG PO CAPS
200.0000 mg | ORAL_CAPSULE | Freq: Three times a day (TID) | ORAL | 0 refills | Status: AC | PRN
Start: 2020-11-08 — End: 2020-11-15

## 2020-11-08 NOTE — ED Provider Notes (Signed)
[name]    CSN: [id number] Arrival date & time: 11/08/20  5784      History   Chief Complaint Chief Complaint  Patient presents with  . Sore Throat    Since sunday    HPI Vanessa Nolan is a 34 y.o. female history of asthma, migraines, ulcerative colitis, presenting today for evaluation of sore throat.  Patient has had symptoms for approximately 5 to 6 days.  She was seen approximately 3 days ago and treated for viral illness, Covid test negative at that time. Worsening GERD.  Reports improvement followed by worsening.  Voice is hoarse.  HPI  Past Medical History:  Diagnosis Date  . Asthma   . Depression   . Headache    Migraines  . Herniated disc, cervical   . Pregnancy induced hypertension 2015  . Preterm labor   . Ulcerative colitis [facility])     Patient Active Problem List   Diagnosis Date Noted  . Post term pregnancy at [redacted] weeks gestation 04/15/2019  . Excessive weight gain in pregnancy 04/10/2019  . Abnormal glucose affecting pregnancy 02/13/2019  . Obesity in pregnancy 02/06/2019  . BMI 45.0-49.9, adult ([place]) 02/06/2019  . Chronic prescription opiate use 02/06/2019  . Late prenatal care 02/06/2019  . History of preterm delivery 02/06/2019  . Fetal 2cm subdiaphragmatic mass on ultrasound 01/25/2019  . Supervision of high risk pregnancy, antepartum 01/20/2019  . Ulcerative colitis ([place]) 01/20/2019  . History of pre-eclampsia in prior pregnancy, currently pregnant 01/20/2019  . Chronic back pain 01/20/2019  . History of depression 01/20/2019  . Hx of migraine headaches 01/20/2019    Past Surgical History:  Procedure Laterality Date  . CHOLECYSTECTOMY    . COLONOSCOPY    . KNEE SURGERY    . WISDOM TOOTH EXTRACTION      OB History    Gravida  6   Para  5   Term  4   Preterm  1   AB  1   Living  5     SAB  1   IAB      Ectopic      Multiple  0   Live Births  5            Home Medications    Prior to Admission  medications   Medication Sig Start Date End Date Taking? Authorizing Provider  acetaminophen-codeine (TYLENOL #3) 300-30 MG tablet Take 1-2 tablets by mouth every 8 (eight) hours as needed for moderate pain or severe pain. 10/16/19  Yes Lott Seelbach C, PA-C  albuterol (VENTOLIN HFA) 108 (90 Base) MCG/ACT inhaler Inhale 2 puffs into the lungs every 6 (six) hours as needed for wheezing or shortness of breath.   Yes [provider]  gabapentin (NEURONTIN) 800 MG tablet Take 800 mg by mouth 3 (three) times daily.   Yes [provider]  methocarbamol (ROBAXIN) 750 MG tablet Take 750 mg by mouth 4 (four) times daily.   Yes [provider]  omeprazole (PRILOSEC) 20 MG capsule Take 2 capsules (40 mg total) by mouth daily. 03/20/19  Yes Truett Mainland, DO  oxyCODONE (ROXICODONE) 15 MG immediate release tablet Take 15 mg by mouth 3 (three) times daily as needed. 10/23/20  Yes [provider]  acetaminophen (TYLENOL) 500 MG tablet Take 1,000 mg by mouth every 6 (six) hours as needed for mild pain or headache.     [provider]  benzonatate (TESSALON) 200 MG capsule Take 1 capsule (200  mg total) by mouth 3 (three) times daily as needed for up to 7 days for cough. 11/08/20 11/15/20  Maryjayne Kleven C, PA-C  doxycycline (VIBRAMYCIN) 100 MG capsule Take 1 capsule (100 mg total) by mouth 2 (two) times daily for 7 days. 11/08/20 11/15/20  Chaz Ronning C, PA-C  ibuprofen (ADVIL) 600 MG tablet Take 1 tablet (600 mg total) by mouth every 6 (six) hours. 04/18/19   Tamala Julian, [place], CNM  mesalamine (LIALDA) 1.2 g EC tablet Take 2 tablets by mouth twice daily 09/12/19   Ladene Artist, MD  nystatin-triamcinolone [facility] II) cream Apply 1 application topically 3 (three) times daily. 05/30/19   Tresea Mall, CNM  cetirizine (ZYRTEC ALLERGY) 10 MG tablet Take 1 tablet (10 mg total) by mouth daily. 04/15/20 11/08/20  Montine Circle, PA-C  hydrochlorothiazide  (HYDRODIURIL) 25 MG tablet Take 1 tablet (25 mg total) by mouth daily. 04/18/19 10/16/19  Tamala Julian, [place], CNM  metoCLOPramide (REGLAN) 10 MG tablet Take 10 mg by mouth daily as needed (For migrane.).  10/16/19  [provider]  norethindrone (MICRONOR) 0.35 MG tablet Take 1 tablet (0.35 mg total) by mouth daily. 05/14/19 10/16/19  Manya Silvas, CNM    Family History Family History  Problem Relation Age of Onset  . Diabetes Father   . Cancer Brother        testicular  . Cancer Paternal Grandfather     Social History Social History   Tobacco Use  . Smoking status: Never Smoker  . Smokeless tobacco: Never Used  Vaping Use  . Vaping Use: Never used  Substance Use Topics  . Alcohol use: Not Currently  . Drug use: Never     Allergies   Morphine and related   Review of Systems Review of Systems  Constitutional: Negative for activity change, appetite change, chills, fatigue and fever.  HENT: Positive for congestion, rhinorrhea, sore throat and voice change. Negative for ear pain, sinus pressure and trouble swallowing.   Eyes: Negative for discharge and redness.  Respiratory: Positive for cough. Negative for chest tightness and shortness of breath.   Cardiovascular: Negative for chest pain.  Gastrointestinal: Negative for abdominal pain, diarrhea, nausea and vomiting.  Musculoskeletal: Negative for myalgias.  Skin: Negative for rash.  Neurological: Negative for dizziness, light-headedness and headaches.     Physical Exam Triage Vital Signs ED Triage Vitals  Enc Vitals Group     BP      Pulse      Resp      Temp      Temp src      SpO2      Weight      Height      Head Circumference      Peak Flow      Pain Score      Pain Loc      Pain Edu?      Excl. in [place]?    No data found.  Updated Vital Signs BP 117/67 (BP Location: Left Arm)   Pulse 72   Temp 98 F (36.7 C) (Oral)   Resp 19   LMP 10/29/2020   SpO2 97%   Visual Acuity Right Eye  Distance:   Left Eye Distance:   Bilateral Distance:    Right Eye Near:   Left Eye Near:    Bilateral Near:     Physical Exam Vitals and nursing note reviewed.  Constitutional:      Appearance: She is well-developed and well-nourished.  Comments: No acute distress  HENT:     Head: Normocephalic and atraumatic.     Ears:     Comments: Bilateral ears without tenderness to palpation of external auricle, tragus and mastoid, EAC's without erythema or swelling, TM's with good bony landmarks and cone of light. Non erythematous.     Nose: Nose normal.     Mouth/Throat:     Comments: Oral mucosa pink and moist, no tonsillar enlargement or exudate. Posterior pharynx patent and nonerythematous, no uvula deviation or swelling.  Voice mildly hoarse Eyes:     Conjunctiva/sclera: Conjunctivae normal.  Cardiovascular:     Rate and Rhythm: Normal rate and regular rhythm.  Pulmonary:     Effort: Pulmonary effort is normal. No respiratory distress.     Comments: Breathing comfortably at rest, CTABL, no wheezing, rales or other adventitious sounds auscultated Abdominal:     General: There is no distension.  Musculoskeletal:        General: Normal range of motion.     Cervical back: Neck supple.  Skin:    General: Skin is warm and dry.  Neurological:     Mental Status: She is alert and oriented to person, place, and time.  Psychiatric:        Mood and Affect: Mood and affect normal.      UC Treatments / Results  Labs (all labs ordered are listed, but only abnormal results are displayed) Labs Reviewed  CULTURE, GROUP A STREP [facility])  POCT RAPID STREP A (OFFICE)    EKG   Radiology No results found.  Procedures Procedures (including critical care time)  Medications Ordered in UC Medications - No data to display  Initial Impression / Assessment and Plan / UC Course  I have reviewed the triage vital signs and the nursing notes.  Pertinent labs & imaging results that were  available during my care of the patient were reviewed by me and considered in my medical decision making (see chart for details).     Prior Covid test negative, strep test negative today.  Suspect symptoms most likely viral, but given symptoms approaching 1 week and recent double sickening opting to go ahead and initiate on antibiotic therapy to cover for sinusitis.  Continue symptomatic and supportive care as well rest and fluids.  Discussed strict return precautions. Patient verbalized understanding and is agreeable with plan.  Final Clinical Impressions(s) / UC Diagnoses   Final diagnoses:  Acute non-recurrent frontal sinusitis     Discharge Instructions     Strep test negative Begin doxycycline to treat sinus infection Begin daily loratadine to help with postnasal drainage contributing to sore throat and hoarseness Tessalon for cough Rest and fluids   Follow-up if not improving or worsening    ED Prescriptions    Medication Sig Dispense Auth. Provider   doxycycline (VIBRAMYCIN) 100 MG capsule Take 1 capsule (100 mg total) by mouth 2 (two) times daily for 7 days. 14 capsule Lanard Arguijo C, PA-C   benzonatate (TESSALON) 200 MG capsule Take 1 capsule (200 mg total) by mouth 3 (three) times daily as needed for up to 7 days for cough. 28 capsule Javarion Douty, [place] C, PA-C     PDMP not reviewed this encounter.   Janith Lima, PA-C 11/08/20 1025

## 2020-11-08 NOTE — ED Triage Notes (Signed)
Pt is still having the same symptoms from the 11/05/20 visit. Pt is complaining of sore throat and headache as well as congestion. Pt is aox4 and ambulatory.

## 2020-11-08 NOTE — Discharge Instructions (Signed)
Strep test negative Begin doxycycline to treat sinus infection Begin daily loratadine to help with postnasal drainage contributing to sore throat and hoarseness Tessalon for cough Rest and fluids   Follow-up if not improving or worsening

## 2020-11-11 LAB — CULTURE, GROUP A STREP (THRC)

## 2020-11-27 ENCOUNTER — Ambulatory Visit: Admit: 2020-11-27 | Discharge status: Against Medical Advice | Payer: Medicaid Other

## 2021-03-20 ENCOUNTER — Ambulatory Visit: Payer: Medicaid Other | Admitting: Gastroenterology

## 2021-04-15 ENCOUNTER — Ambulatory Visit: Payer: Medicaid Other | Admitting: Gastroenterology

## 2021-04-23 ENCOUNTER — Telehealth: Payer: Self-pay | Admitting: Gastroenterology

## 2021-04-23 NOTE — Telephone Encounter (Signed)
Patient dismissed from [facility] by Lucio Edward, MD, effective 04/16/21 Dismissal Letter sent out by 1st class mail. [employer]

## 2021-06-09 ENCOUNTER — Ambulatory Visit: Payer: Medicaid Other | Admitting: Gastroenterology

## 2021-09-23 ENCOUNTER — Emergency Department (HOSPITAL_COMMUNITY)
Admission: EM | Admit: 2021-09-23 | Discharge: 2021-09-24 | Disposition: A | Discharge status: Home / Self Care | Payer: Medicaid Other | Attending: Emergency Medicine | Admitting: Emergency Medicine

## 2021-09-23 DIAGNOSIS — R109 Unspecified abdominal pain: Secondary | ICD-10-CM | POA: Insufficient documentation

## 2021-09-23 DIAGNOSIS — Z5321 Procedure and treatment not carried out due to patient leaving prior to being seen by health care provider: Secondary | ICD-10-CM | POA: Insufficient documentation

## 2021-10-22 ENCOUNTER — Encounter: Payer: Self-pay | Admitting: Family

## 2021-10-22 ENCOUNTER — Telehealth: Payer: Medicaid Other | Admitting: Family

## 2021-10-22 DIAGNOSIS — K047 Periapical abscess without sinus: Secondary | ICD-10-CM

## 2021-10-22 MED ORDER — CLINDAMYCIN HCL 300 MG PO CAPS: 300.0000 mg | ORAL_CAPSULE | Freq: Three times a day (TID) | ORAL | 0 refills | Status: AC

## 2021-10-22 NOTE — Progress Notes (Signed)
Virtual Visit Consent   Vanessa Nolan, you are scheduled for a virtual visit with a [facility] provider today.     Just as with appointments in the office, your consent must be obtained to participate.  Your consent will be active for this visit and any virtual visit you may have with one of our providers in the next 365 days.     If you have a MyChart account, a copy of this consent can be sent to you electronically.  All virtual visits are billed to your insurance company just like a traditional visit in the office.    As this is a virtual visit, video technology does not allow for your provider to perform a traditional examination.  This may limit your provider's ability to fully assess your condition.  If your provider identifies any concerns that need to be evaluated in person or the need to arrange testing (such as labs, EKG, etc.), we will make arrangements to do so.     Although advances in technology are sophisticated, we cannot ensure that it will always work on either your end or our end.  If the connection with a video visit is poor, the visit may have to be switched to a telephone visit.  With either a video or telephone visit, we are not always able to ensure that we have a secure connection.     I need to obtain your verbal consent now.   Are you willing to proceed with your visit today?    Vanessa Nolan has provided verbal consent on 10/22/2021 for a virtual visit (video or telephone).   Evelina Dun, FNP   Date: 10/22/2021 3:27 PM   Virtual Visit via Video Note   I, Evelina Dun, connected with  Vanessa Nolan  ([id number], Aug 08, 1986) on 10/22/21 at  3:15 PM EST by a video-enabled telemedicine application and verified that I am speaking with the correct person using two identifiers.  Location: Patient: Virtual Visit Location Patient: Other: vacation Provider: Virtual Visit Location Provider: Home Office   I discussed the limitations of evaluation and management by  telemedicine and the availability of in person appointments. The patient expressed understanding and agreed to proceed.    History of Present Illness: Vanessa Nolan is a 35 y.o. who identifies as a female who was assigned female at birth, and is being seen today for tooth pain. She is complaining of lower left gum. She reports she broke her tooth 3-4 weeks ago and was going to get it fixed after the holidays. She reports over the last week she has started having increased pain. She reports aching pain of 10 out 10. She has had some thick discharge. Denies fever or SOB. She has been taking motrin and tylenol with mild relief.   HPI: HPI  Problems:  Patient Active Problem List   Diagnosis Date Noted   Post term pregnancy at [redacted] weeks gestation 04/15/2019   Excessive weight gain in pregnancy 04/10/2019   Abnormal glucose affecting pregnancy 02/13/2019   Obesity in pregnancy 02/06/2019   BMI 45.0-49.9, adult ([place]) 02/06/2019   Chronic prescription opiate use 02/06/2019   Late prenatal care 02/06/2019   History of preterm delivery 02/06/2019   Fetal 2cm subdiaphragmatic mass on ultrasound 01/25/2019   Supervision of high risk pregnancy, antepartum 01/20/2019   Ulcerative colitis ([place]) 01/20/2019   History of pre-eclampsia in prior pregnancy, currently pregnant 01/20/2019   Chronic back pain 01/20/2019   History of depression 01/20/2019   Hx of  migraine headaches 01/20/2019    Allergies:  Allergies  Allergen Reactions   Morphine And Related Other (See Comments)    tachycardia   Medications:  Current Outpatient Medications:    clindamycin (CLEOCIN) 300 MG capsule, Take 1 capsule (300 mg total) by mouth 3 (three) times daily., Disp: 30 capsule, Rfl: 0   acetaminophen (TYLENOL) 500 MG tablet, Take 1,000 mg by mouth every 6 (six) hours as needed for mild pain or headache. , Disp: , Rfl:    albuterol (VENTOLIN HFA) 108 (90 Base) MCG/ACT inhaler, Inhale 2 puffs into the lungs every 6 (six)  hours as needed for wheezing or shortness of breath., Disp: , Rfl:    gabapentin (NEURONTIN) 800 MG tablet, Take 800 mg by mouth 3 (three) times daily., Disp: , Rfl:    ibuprofen (ADVIL) 600 MG tablet, Take 1 tablet (600 mg total) by mouth every 6 (six) hours., Disp: 30 tablet, Rfl: 1   mesalamine (LIALDA) 1.2 g EC tablet, Take 2 tablets by mouth twice daily, Disp: 120 tablet, Rfl: 0   methocarbamol (ROBAXIN) 750 MG tablet, Take 750 mg by mouth 4 (four) times daily., Disp: , Rfl:    nystatin-triamcinolone (MYCOLOG II) cream, Apply 1 application topically 3 (three) times daily., Disp: 30 g, Rfl: 0   omeprazole (PRILOSEC) 20 MG capsule, Take 2 capsules (40 mg total) by mouth daily., Disp: 90 capsule, Rfl: 1   oxyCODONE (ROXICODONE) 15 MG immediate release tablet, Take 15 mg by mouth 3 (three) times daily as needed., Disp: , Rfl:   Observations/Objective: Patient is well-developed, well-nourished in no acute distress.  Resting comfortably   Head is normocephalic, atraumatic.  No labored breathing.  Speech is clear and coherent with logical content.  Patient is alert and oriented at baseline.    Assessment and Plan: 1. Abscessed tooth - clindamycin (CLEOCIN) 300 MG capsule; Take 1 capsule (300 mg total) by mouth 3 (three) times daily.  Dispense: 30 capsule; Refill: 0 Start clindamycin today Rinse mouth after eating Alternate tylenol and motrin  Follow up with dentist   Follow Up Instructions: I discussed the assessment and treatment plan with the patient. The patient was provided an opportunity to ask questions and all were answered. The patient agreed with the plan and demonstrated an understanding of the instructions.  A copy of instructions were sent to the patient via MyChart unless otherwise noted below.     The patient was advised to call back or seek an in-person evaluation if the symptoms worsen or if the condition fails to improve as anticipated.  Time:  I spent 18 minutes with  the patient via telehealth technology discussing the above problems/concerns.    Evelina Dun, FNP

## 2022-06-18 ENCOUNTER — Other Ambulatory Visit (HOSPITAL_COMMUNITY): Payer: Self-pay

## 2022-06-18 MED ORDER — OXYCODONE HCL 20 MG PO TABS
ORAL_TABLET | ORAL | 0 refills | Status: AC
  Filled 2022-06-18: qty 90, 30d supply, fill #0

## 2022-09-28 ENCOUNTER — Ambulatory Visit
Admission: EM | Admit: 2022-09-28 | Discharge: 2022-09-28 | Disposition: A | Discharge status: Home / Self Care | Payer: Medicaid Other | Attending: Family Medicine | Admitting: Family Medicine

## 2022-09-28 DIAGNOSIS — R110 Nausea: Secondary | ICD-10-CM

## 2022-09-28 DIAGNOSIS — R059 Cough, unspecified: Secondary | ICD-10-CM

## 2022-09-28 DIAGNOSIS — J309 Allergic rhinitis, unspecified: Secondary | ICD-10-CM

## 2022-09-28 MED ORDER — ONDANSETRON 8 MG PO TBDP: 8.0000 mg | ORAL_TABLET | Freq: Three times a day (TID) | ORAL | 0 refills | Status: AC | PRN

## 2022-09-28 MED ORDER — FEXOFENADINE HCL 180 MG PO TABS: 180.0000 mg | ORAL_TABLET | Freq: Every day | ORAL | 0 refills | Status: AC

## 2022-09-28 NOTE — ED Provider Notes (Signed)
[facility]    CSN: [id number] Arrival date & time: 09/28/22  1522      History   Chief Complaint Chief Complaint  Patient presents with   Vomiting    Vomiting, fever, chills, fatigue, and nasal congestion. X2 weeks    HPI Vanessa Nolan is a 36 y.o. female.   HPI 36 year old female presents with vomiting, fever, chills, fatigue and nasal congestion for 2 weeks.  Additionally, patient presents with her 5 children to be evaluated as well.  Patient reports she may need a work note for this evening.  PMH significant for morbid obesity, ulcerative colitis, and asthma.  Past Medical History:  Diagnosis Date   Asthma    Depression    Headache    Migraines   Herniated disc, cervical    Pregnancy induced hypertension 2015   Preterm labor    Ulcerative colitis [facility])     Patient Active Problem List   Diagnosis Date Noted   Post term pregnancy at [redacted] weeks gestation 04/15/2019   Excessive weight gain in pregnancy 04/10/2019   Abnormal glucose affecting pregnancy 02/13/2019   Obesity in pregnancy 02/06/2019   BMI 45.0-49.9, adult ([place]) 02/06/2019   Chronic prescription opiate use 02/06/2019   Late prenatal care 02/06/2019   History of preterm delivery 02/06/2019   Fetal 2cm subdiaphragmatic mass on ultrasound 01/25/2019   Supervision of high risk pregnancy, antepartum 01/20/2019   Ulcerative colitis ([place]) 01/20/2019   History of pre-eclampsia in prior pregnancy, currently pregnant 01/20/2019   Chronic back pain 01/20/2019   History of depression 01/20/2019   Hx of migraine headaches 01/20/2019    Past Surgical History:  Procedure Laterality Date   CHOLECYSTECTOMY     COLONOSCOPY     KNEE SURGERY     WISDOM TOOTH EXTRACTION      OB History     Gravida  6   Para  5   Term  4   Preterm  1   AB  1   Living  5      SAB  1   IAB      Ectopic      Multiple  0   Live Births  5            Home Medications    Prior to Admission  medications   Medication Sig Start Date End Date Taking? Authorizing Provider  acetaminophen (TYLENOL) 500 MG tablet Take 1,000 mg by mouth every 6 (six) hours as needed for mild pain or headache.    Yes [provider]  albuterol (VENTOLIN HFA) 108 (90 Base) MCG/ACT inhaler Inhale 2 puffs into the lungs every 6 (six) hours as needed for wheezing or shortness of breath.   Yes [provider]  clindamycin (CLEOCIN) 300 MG capsule Take 1 capsule (300 mg total) by mouth 3 (three) times daily. 10/22/21  Yes Hawks, Christy A, FNP  fexofenadine (ALLEGRA ALLERGY) 180 MG tablet Take 1 tablet (180 mg total) by mouth daily for 15 days. 09/28/22 10/13/22 Yes Eliezer Lofts, FNP  gabapentin (NEURONTIN) 800 MG tablet Take 800 mg by mouth 3 (three) times daily.   Yes [provider]  ibuprofen (ADVIL) 600 MG tablet Take 1 tablet (600 mg total) by mouth every 6 (six) hours. 04/18/19  Yes Smith, [place], CNM  mesalamine (LIALDA) 1.2 g EC tablet Take 2 tablets by mouth twice daily 09/12/19  Yes Ladene Artist, MD  methocarbamol (ROBAXIN) 750 MG tablet Take 750 mg by mouth  4 (four) times daily.   Yes [provider]  omeprazole (PRILOSEC) 20 MG capsule Take 2 capsules (40 mg total) by mouth daily. 03/20/19  Yes Truett Mainland, DO  ondansetron (ZOFRAN-ODT) 8 MG disintegrating tablet Take 1 tablet (8 mg total) by mouth every 8 (eight) hours as needed for nausea or vomiting. 09/28/22  Yes Eliezer Lofts, FNP  oxyCODONE (ROXICODONE) 15 MG immediate release tablet Take 15 mg by mouth 3 (three) times daily as needed. 10/23/20  Yes [provider]  Oxycodone HCl 20 MG TABS Take 1 tablet by mouth 3 times daily as needed 06/18/22  Yes   nystatin-triamcinolone (MYCOLOG II) cream Apply 1 application topically 3 (three) times daily. 05/30/19   Tresea Mall, CNM  cetirizine (ZYRTEC ALLERGY) 10 MG tablet Take 1 tablet (10 mg total) by mouth daily. 04/15/20 11/08/20  Montine Circle,  PA-C  hydrochlorothiazide (HYDRODIURIL) 25 MG tablet Take 1 tablet (25 mg total) by mouth daily. 04/18/19 10/16/19  Tamala Julian, [place], CNM  metoCLOPramide (REGLAN) 10 MG tablet Take 10 mg by mouth daily as needed (For migrane.).  10/16/19  [provider]  norethindrone (MICRONOR) 0.35 MG tablet Take 1 tablet (0.35 mg total) by mouth daily. 05/14/19 10/16/19  Manya Silvas, CNM    Family History Family History  Problem Relation Age of Onset   Diabetes Father    Cancer Brother        testicular   Cancer Paternal Grandfather     Social History Social History   Tobacco Use   Smoking status: Never   Smokeless tobacco: Never  Vaping Use   Vaping Use: Never used  Substance Use Topics   Alcohol use: Not Currently   Drug use: Never     Allergies   Morphine and related   Review of Systems Review of Systems  Respiratory:  Positive for cough.   All other systems reviewed and are negative.    Physical Exam Triage Vital Signs ED Triage Vitals  Enc Vitals Group     BP      Pulse      Resp      Temp      Temp src      SpO2      Weight      Height      Head Circumference      Peak Flow      Pain Score      Pain Loc      Pain Edu?      Excl. in [place]?    No data found.  Updated Vital Signs BP 129/85 (BP Location: Left Arm)   Pulse 73   Temp 98.8 F (37.1 C) (Oral)   Resp 18   LMP 09/21/2022   SpO2 95%    Physical Exam Vitals and nursing note reviewed.  Constitutional:      General: She is not in acute distress.    Appearance: Normal appearance. She is obese. She is not ill-appearing.  HENT:     Head: Normocephalic and atraumatic.     Right Ear: Tympanic membrane, ear canal and external ear normal.     Left Ear: Tympanic membrane, ear canal and external ear normal.     Nose: Nose normal.     Mouth/Throat:     Mouth: Mucous membranes are moist.     Pharynx: Oropharynx is clear.     Comments: Moderate amount of clear drainage of posterior oropharynx  noted Eyes:     Extraocular  Movements: Extraocular movements intact.     Conjunctiva/sclera: Conjunctivae normal.     Pupils: Pupils are equal, round, and reactive to light.  Cardiovascular:     Rate and Rhythm: Normal rate and regular rhythm.     Pulses: Normal pulses.     Heart sounds: Normal heart sounds.  Pulmonary:     Effort: Pulmonary effort is normal.     Breath sounds: Normal breath sounds. No wheezing, rhonchi or rales.  Musculoskeletal:        General: Normal range of motion.     Cervical back: Normal range of motion and neck supple.  Skin:    General: Skin is warm and dry.  Neurological:     General: No focal deficit present.     Mental Status: She is alert and oriented to person, place, and time.      UC Treatments / Results  Labs (all labs ordered are listed, but only abnormal results are displayed) Labs Reviewed - No data to display  EKG   Radiology No results found.  Procedures Procedures (including critical care time)  Medications Ordered in UC Medications - No data to display  Initial Impression / Assessment and Plan / UC Course  I have reviewed the triage vital signs and the nursing notes.  Pertinent labs & imaging results that were available during my care of the patient were reviewed by me and considered in my medical decision making (see chart for details).     MDM: 1. Nausea-Rx'd Zofran.  2.  Allergic rhinitis-Rx'd Allegra. Advised patient may take Zofran daily or as needed for nausea.  Advised patient may take Allegra daily or as needed for concurrent postnasal drainage/drip.  Advised patient if symptoms worsen and/or unresolved please follow-up with your PCP for further evaluation.  Discharged home, hemodynamically stable. Final Clinical Impressions(s) / UC Diagnoses   Final diagnoses:  Nausea  Allergic rhinitis, unspecified seasonality, unspecified trigger     Discharge Instructions      Advised patient may take Zofran daily or as  needed for nausea.  Advised patient may take Allegra daily or as needed for concurrent postnasal drainage/drip.  Advised patient if symptoms worsen and/or unresolved please follow-up with your PCP for further evaluation.     ED Prescriptions     Medication Sig Dispense Auth. Provider   ondansetron (ZOFRAN-ODT) 8 MG disintegrating tablet Take 1 tablet (8 mg total) by mouth every 8 (eight) hours as needed for nausea or vomiting. 24 tablet Eliezer Lofts, FNP   fexofenadine [facility] ALLERGY) 180 MG tablet Take 1 tablet (180 mg total) by mouth daily for 15 days. 15 tablet Eliezer Lofts, FNP      PDMP not reviewed this encounter.   Eliezer Lofts, [place] 09/28/22 1659

## 2022-09-28 NOTE — Discharge Instructions (Addendum)
Advised patient may take Zofran daily or as needed for nausea.  Advised patient may take Allegra daily or as needed for concurrent postnasal drainage/drip.  Advised patient if symptoms worsen and/or unresolved please follow-up with your PCP for further evaluation.

## 2022-09-28 NOTE — ED Triage Notes (Signed)
Pt states that she has vomiting, fever, chills, fatigue and nasal congestion. X2 weeks

## 2023-01-04 ENCOUNTER — Emergency Department (HOSPITAL_BASED_OUTPATIENT_CLINIC_OR_DEPARTMENT_OTHER): Payer: Medicaid Other

## 2023-01-04 ENCOUNTER — Emergency Department (HOSPITAL_BASED_OUTPATIENT_CLINIC_OR_DEPARTMENT_OTHER)
Admission: EM | Admit: 2023-01-04 | Discharge: 2023-01-05 | Disposition: A | Discharge status: Home / Self Care | Payer: Medicaid Other | Attending: Emergency Medicine | Admitting: Emergency Medicine

## 2023-01-04 ENCOUNTER — Other Ambulatory Visit: Payer: Self-pay

## 2023-01-04 ENCOUNTER — Encounter (HOSPITAL_BASED_OUTPATIENT_CLINIC_OR_DEPARTMENT_OTHER): Payer: Self-pay | Admitting: Emergency Medicine

## 2023-01-04 DIAGNOSIS — M5136 Other intervertebral disc degeneration, lumbar region: Secondary | ICD-10-CM | POA: Diagnosis not present

## 2023-01-04 DIAGNOSIS — M549 Dorsalgia, unspecified: Secondary | ICD-10-CM | POA: Diagnosis present

## 2023-01-04 DIAGNOSIS — J45909 Unspecified asthma, uncomplicated: Secondary | ICD-10-CM | POA: Diagnosis not present

## 2023-01-04 DIAGNOSIS — M51369 Other intervertebral disc degeneration, lumbar region without mention of lumbar back pain or lower extremity pain: Secondary | ICD-10-CM

## 2023-01-04 DIAGNOSIS — M5430 Sciatica, unspecified side: Secondary | ICD-10-CM

## 2023-01-04 LAB — PREGNANCY, URINE: Preg Test, Ur: NEGATIVE

## 2023-01-04 MED ORDER — METHOCARBAMOL 500 MG PO TABS
1000.0000 mg | ORAL_TABLET | Freq: Once | ORAL | Status: AC
  Administered 2023-01-04: 1000 mg via ORAL
  Filled 2023-01-04: qty 2

## 2023-01-04 MED ORDER — DEXAMETHASONE SODIUM PHOSPHATE 10 MG/ML IJ SOLN
10.0000 mg | Freq: Once | INTRAMUSCULAR | Status: AC
  Administered 2023-01-04: 10 mg via INTRAMUSCULAR
  Filled 2023-01-04: qty 1

## 2023-01-04 MED ORDER — KETOROLAC TROMETHAMINE 60 MG/2ML IM SOLN
60.0000 mg | Freq: Once | INTRAMUSCULAR | Status: AC
  Administered 2023-01-04: 60 mg via INTRAMUSCULAR
  Filled 2023-01-04: qty 2

## 2023-01-04 NOTE — ED Triage Notes (Signed)
Pt states child jumped on her back yesterday. Hx of sciatica. Is having hip pain and pain radiating down left leg stabbing in nature. Took gabapentin and oxy from home supply. Pt obese. Denies incontinence of bowel and bladder.

## 2023-01-04 NOTE — ED Notes (Signed)
Patient transported to X-ray 

## 2023-01-05 MED ORDER — MELOXICAM 15 MG PO TABS: 15.0000 mg | ORAL_TABLET | Freq: Every day | ORAL | 0 refills | Status: AC

## 2023-01-05 MED ORDER — LIDOCAINE 5 % EX PTCH: 1.0000 | MEDICATED_PATCH | CUTANEOUS | 0 refills | Status: AC

## 2023-01-05 NOTE — ED Provider Notes (Signed)
[place] [facility] [place] Provider Note   CSN: [id number] Arrival date & time: 01/04/23  2241     History  No chief complaint on file.   Vanessa Nolan is a 37 y.o. female.  The history is provided by the patient.  Back Pain Location:  Gluteal region Quality:  Shooting Radiates to:  L thigh Pain severity:  Severe Pain is:  Same all the time Onset quality:  Sudden Duration: day 1. Timing:  Constant Progression:  Unchanged Chronicity:  New Context comment:  Child jumped on that area Relieved by:  Nothing Worsened by:  Nothing Ineffective treatments: home oxycodone and gabapentin. Associated symptoms: no fever, no paresthesias, no perianal numbness, no weakness and no weight loss   Risk factors: no hx of cancer   Patient with sciatica presents with sciatica pain since yesterday when her child jumped on the area.  Patient is on oxycodone and gabapentin for chronic pain and this is not helpin.  No gait dysfunction.  No bowel or bladder symptoms.  Patient states steroids do not work for her for pain.      Past Medical History:  Diagnosis Date   Asthma    Depression    Headache    Migraines   Herniated disc, cervical    Pregnancy induced hypertension 2015   Preterm labor    Ulcerative colitis ([place])      Home Medications Prior to Admission medications   Medication Sig Start Date End Date Taking? Authorizing Provider  lidocaine (LIDODERM) 5 % Place 1 patch onto the skin daily. Remove & Discard patch within 12 hours or as directed by MD 01/05/23  Yes Genessa Beman, MD  meloxicam (MOBIC) 15 MG tablet Take 1 tablet (15 mg total) by mouth daily. 01/05/23  Yes Edmond Ginsberg, MD  acetaminophen (TYLENOL) 500 MG tablet Take 1,000 mg by mouth every 6 (six) hours as needed for mild pain or headache.     [provider]  albuterol (VENTOLIN HFA) 108 (90 Base) MCG/ACT inhaler Inhale 2 puffs into the lungs every 6 (six) hours as needed for  wheezing or shortness of breath.    [provider]  clindamycin (CLEOCIN) 300 MG capsule Take 1 capsule (300 mg total) by mouth 3 (three) times daily. 10/22/21   Sharion Balloon, FNP  fexofenadine (ALLEGRA ALLERGY) 180 MG tablet Take 1 tablet (180 mg total) by mouth daily for 15 days. 09/28/22 10/13/22  Eliezer Lofts, FNP  gabapentin (NEURONTIN) 800 MG tablet Take 800 mg by mouth 3 (three) times daily.    [provider]  mesalamine (LIALDA) 1.2 g EC tablet Take 2 tablets by mouth twice daily 09/12/19   Ladene Artist, MD  methocarbamol (ROBAXIN) 750 MG tablet Take 750 mg by mouth 4 (four) times daily.    [provider]  nystatin-triamcinolone (MYCOLOG II) cream Apply 1 application topically 3 (three) times daily. 05/30/19   Tresea Mall, CNM  omeprazole (PRILOSEC) 20 MG capsule Take 2 capsules (40 mg total) by mouth daily. 03/20/19   Truett Mainland, DO  ondansetron (ZOFRAN-ODT) 8 MG disintegrating tablet Take 1 tablet (8 mg total) by mouth every 8 (eight) hours as needed for nausea or vomiting. 09/28/22   Eliezer Lofts, FNP  oxyCODONE (ROXICODONE) 15 MG immediate release tablet Take 15 mg by mouth 3 (three) times daily as needed. 10/23/20   [provider]  Oxycodone HCl 20 MG TABS Take 1 tablet by mouth 3 times daily as  needed 06/18/22     cetirizine (ZYRTEC ALLERGY) 10 MG tablet Take 1 tablet (10 mg total) by mouth daily. 04/15/20 11/08/20  Montine Circle, PA-C  hydrochlorothiazide (HYDRODIURIL) 25 MG tablet Take 1 tablet (25 mg total) by mouth daily. 04/18/19 10/16/19  Tamala Julian, [place], CNM  metoCLOPramide (REGLAN) 10 MG tablet Take 10 mg by mouth daily as needed (For migrane.).  10/16/19  [provider]  norethindrone (MICRONOR) 0.35 MG tablet Take 1 tablet (0.35 mg total) by mouth daily. 05/14/19 10/16/19  Tamala Julian, [place], CNM      Allergies    Morphine and related    Review of Systems   Review of Systems  Constitutional:  Negative for  fever and weight loss.  HENT:  Negative for facial swelling.   Gastrointestinal:  Negative for vomiting.  Genitourinary:  Negative for difficulty urinating.  Musculoskeletal:  Positive for back pain.  Neurological:  Negative for weakness and paresthesias.  All other systems reviewed and are negative.   Physical Exam Updated Vital Signs BP (!) 136/100 (BP Location: Left Arm)   Pulse (!) 115   Temp 98 F (36.7 C)   Resp 18   Ht 5\' 6"  (1.676 m)   Wt 123.4 kg   LMP 12/11/2022   SpO2 97%   BMI 43.90 kg/m  Physical Exam Vitals and nursing note reviewed.  Constitutional:      General: She is not in acute distress.    Appearance: Normal appearance. She is well-developed.  HENT:     Head: Normocephalic and atraumatic.     Nose: Nose normal.  Eyes:     Pupils: Pupils are equal, round, and reactive to light.  Cardiovascular:     Rate and Rhythm: Normal rate and regular rhythm.     Pulses: Normal pulses.     Heart sounds: Normal heart sounds.  Pulmonary:     Effort: Pulmonary effort is normal. No respiratory distress.     Breath sounds: Normal breath sounds.  Abdominal:     General: Bowel sounds are normal. There is no distension.     Palpations: Abdomen is soft.     Tenderness: There is no abdominal tenderness. There is no guarding or rebound.  Genitourinary:    Vagina: No vaginal discharge.  Musculoskeletal:        General: Normal range of motion.     Cervical back: Normal and neck supple.     Thoracic back: Normal.     Lumbar back: Normal.     Left hip: Normal.     Left upper leg: Normal.     Left knee: Normal.  Skin:    General: Skin is warm and dry.     Capillary Refill: Capillary refill takes less than 2 seconds.     Findings: No erythema or rash.  Neurological:     General: No focal deficit present.     Mental Status: She is alert and oriented to person, place, and time.     Deep Tendon Reflexes: Reflexes normal.  Psychiatric:        Mood and Affect: Mood  normal.     ED Results / Procedures / Treatments   Labs (all labs ordered are listed, but only abnormal results are displayed) Results for orders placed or performed during the hospital encounter of 01/04/23  Pregnancy, urine  Result Value Ref Range   Preg Test, Ur NEGATIVE NEGATIVE   DG Lumbar Spine Complete  Result Date: 01/05/2023 CLINICAL DATA:  Left-sided leg pain EXAM: LUMBAR  SPINE - COMPLETE 4+ VIEW COMPARISON:  None Available. FINDINGS: Lumbar alignment within normal limits. Vertebral body heights are maintained. Mild disc space narrowing at L5-S1. Remaining disc spaces appear patent IMPRESSION: Mild disc space narrowing at L5-S1. Electronically Signed   By: Jasmine Pang M.D.   On: 01/05/2023 00:13   DG HIPS BILAT WITH PELVIS MIN 5 VIEWS  Result Date: 01/05/2023 CLINICAL DATA:  Left-sided hip pain EXAM: DG HIP (WITH OR WITHOUT PELVIS) 5+V BILAT COMPARISON:  None Available. FINDINGS: There is no evidence of hip fracture or dislocation. There is no evidence of arthropathy or other focal bone abnormality. IMPRESSION: Negative. Electronically Signed   By: Jasmine Pang M.D.   On: 01/05/2023 00:12     Radiology DG Lumbar Spine Complete  Result Date: 01/05/2023 CLINICAL DATA:  Left-sided leg pain EXAM: LUMBAR SPINE - COMPLETE 4+ VIEW COMPARISON:  None Available. FINDINGS: Lumbar alignment within normal limits. Vertebral body heights are maintained. Mild disc space narrowing at L5-S1. Remaining disc spaces appear patent IMPRESSION: Mild disc space narrowing at L5-S1. Electronically Signed   By: Jasmine Pang M.D.   On: 01/05/2023 00:13   DG HIPS BILAT WITH PELVIS MIN 5 VIEWS  Result Date: 01/05/2023 CLINICAL DATA:  Left-sided hip pain EXAM: DG HIP (WITH OR WITHOUT PELVIS) 5+V BILAT COMPARISON:  None Available. FINDINGS: There is no evidence of hip fracture or dislocation. There is no evidence of arthropathy or other focal bone abnormality. IMPRESSION: Negative. Electronically Signed   By:  Jasmine Pang M.D.   On: 01/05/2023 00:12    Procedures Procedures    Medications Ordered in ED Medications  ketorolac (TORADOL) injection 60 mg (60 mg Intramuscular Given 01/04/23 2340)  dexamethasone (DECADRON) injection 10 mg (10 mg Intramuscular Given 01/04/23 2340)  methocarbamol (ROBAXIN) tablet 1,000 mg (1,000 mg Oral Given 01/04/23 2342)    ED Course/ Medical Decision Making/ A&P                             Medical Decision Making Patient with known sciatica presents with recurrence of symptoms post child jumping on the area   Problems Addressed: DDD (degenerative disc disease), lumbar:    Details: Follow up with [facility] for ongoing diagnosis and treatment.  Verbally informed as well as in writing on DC papers  Sciatica, unspecified laterality:    Details: Will add mobic to patient's home narcotics and gabapentin as steroids do not work.  Have also added lidoderm   Amount and/or Complexity of Data Reviewed External Data Reviewed: notes.    Details: Previous notes reviewed  Labs: ordered.    Details: Pregnancy negative  Radiology: ordered and independent interpretation performed.    Details: No fracture of the L spine or pelvis   Risk Prescription drug management. Risk Details: Patient with sciatica, acute on chronic. There are no signs of cauda equina.  There is no indication for emergent MRI tonight.  Intact L5 and S1. Witnessed ambulation to the bathroom.  Intact gait and urination.   Patient is on oxycodone and gabapentin already.  Have added mobic and lidoderm.  Will have patient follow up with spine surgeons for ongoing care.  Strict return.      Final Clinical Impression(s) / ED Diagnoses Final diagnoses:  Sciatica, unspecified laterality  DDD (degenerative disc disease), lumbar   Return for intractable cough, coughing up blood, fevers > 100.4 unrelieved by medication, shortness of breath, intractable vomiting, chest pain, shortness of  breath, weakness,  numbness, changes in speech, facial asymmetry, abdominal pain, passing out, Inability to tolerate liquids or food, cough, altered mental status or any concerns. No signs of systemic illness or infection. The patient is nontoxic-appearing on exam and vital signs are within normal limits.  I have reviewed the triage vital signs and the nursing notes. Pertinent labs & imaging results that were available during my care of the patient were reviewed by me and considered in my medical decision making (see chart for details). After history, exam, and medical workup I feel the patient has been appropriately medically screened and is safe for discharge home. Pertinent diagnoses were discussed with the patient. Patient was given return precautions. Rx / DC Orders ED Discharge Orders          Ordered    lidocaine (LIDODERM) 5 %  Every 24 hours        01/05/23 0025    meloxicam (MOBIC) 15 MG tablet  Daily        01/05/23 0025              Ellarose Brandi, MD 01/05/23 0045

## 2023-01-05 NOTE — ED Notes (Signed)
Discharge paperwork reviewed entirely with patient, including Rx's and follow up care. Pain was under control. Pt verbalized understanding as well as all parties involved. No questions or concerns voiced at the time of discharge. No acute distress noted.   Pt ambulated out to PVA without incident or assistance.  

## 2023-07-21 ENCOUNTER — Ambulatory Visit: Payer: Medicaid Other | Admitting: Registered"

## 2023-11-23 ENCOUNTER — Encounter (HOSPITAL_COMMUNITY): Payer: Self-pay

## 2023-11-23 ENCOUNTER — Ambulatory Visit (HOSPITAL_COMMUNITY)
Admission: EM | Admit: 2023-11-23 | Discharge: 2023-11-23 | Disposition: A | Discharge status: Home / Self Care | Payer: Medicaid Other

## 2023-11-23 DIAGNOSIS — Z8709 Personal history of other diseases of the respiratory system: Secondary | ICD-10-CM

## 2023-11-23 DIAGNOSIS — Z0289 Encounter for other administrative examinations: Secondary | ICD-10-CM

## 2023-11-23 NOTE — ED Notes (Signed)
Work note provided.

## 2023-11-23 NOTE — Discharge Instructions (Signed)
Since you have been on antibiotics for more than 24 hours you can return to work as you are not contagious.  If anything changes please return for reevaluation including increasing sore throat, swelling of her throat, difficulty swallowing, fever.  Throw your toothbrush and replace it with anyone to prevent reinfection.

## 2023-11-23 NOTE — ED Triage Notes (Signed)
Pt presents to urgent care wanting a note to return back to work. Pt states she was Strep + on 12/19. Pt is requesting to be re-swabbed for strep, has been on antibiotics for four days.

## 2023-11-23 NOTE — ED Provider Notes (Signed)
[facility]    CSN: [id number] Arrival date & time: 11/23/23  1343      History   Chief Complaint Chief Complaint  Patient presents with   Letter for School/Work    HPI Vanessa Nolan is a 37 y.o. female.   Patient presents today requesting a return to work note.  Reports that 4 days ago she was seen at a different clinic and diagnosed with strep pharyngitis.  She has been started on antibiotics (cefdinir 300 mg twice daily) and reports compliance with this medication without side effect.  Her sore throat has resolved and she denies any ongoing symptoms including fever, sore throat, dysphagia, nausea, vomiting.  She has missed work but they are requiring that she be seen and cleared to return prompting evaluation today.  She is eating and drinking normally.  She has no additional complaints or concerns today.    Past Medical History:  Diagnosis Date   Asthma    Depression    Headache    Migraines   Herniated disc, cervical    Pregnancy induced hypertension 2015   Preterm labor    Ulcerative colitis [facility])     Patient Active Problem List   Diagnosis Date Noted   Post term pregnancy at [redacted] weeks gestation 04/15/2019   Excessive weight gain in pregnancy 04/10/2019   Abnormal glucose affecting pregnancy 02/13/2019   Obesity in pregnancy 02/06/2019   BMI 45.0-49.9, adult (HCC) 02/06/2019   Chronic prescription opiate use 02/06/2019   Late prenatal care 02/06/2019   History of preterm delivery 02/06/2019   Fetal 2cm subdiaphragmatic mass on ultrasound 01/25/2019   Supervision of high risk pregnancy, antepartum 01/20/2019   Ulcerative colitis (HCC) 01/20/2019   History of pre-eclampsia in prior pregnancy, currently pregnant 01/20/2019   Chronic back pain 01/20/2019   History of depression 01/20/2019   Hx of migraine headaches 01/20/2019    Past Surgical History:  Procedure Laterality Date   CHOLECYSTECTOMY     COLONOSCOPY     KNEE SURGERY     WISDOM TOOTH  EXTRACTION      OB History     Gravida  6   Para  5   Term  4   Preterm  1   AB  1   Living  5      SAB  1   IAB      Ectopic      Multiple  0   Live Births  5            Home Medications    Prior to Admission medications   Medication Sig Start Date End Date Taking? Authorizing Provider  acetaminophen (TYLENOL) 500 MG tablet Take 1,000 mg by mouth every 6 (six) hours as needed for mild pain or headache.     [provider]  albuterol (VENTOLIN HFA) 108 (90 Base) MCG/ACT inhaler Inhale 2 puffs into the lungs every 6 (six) hours as needed for wheezing or shortness of breath.    [provider]  clindamycin (CLEOCIN) 300 MG capsule Take 1 capsule (300 mg total) by mouth 3 (three) times daily. 10/22/21   Junie Spencer, FNP  fexofenadine (ALLEGRA ALLERGY) 180 MG tablet Take 1 tablet (180 mg total) by mouth daily for 15 days. 09/28/22 10/13/22  Trevor Iha, FNP  gabapentin (NEURONTIN) 800 MG tablet Take 800 mg by mouth 3 (three) times daily.    [provider]  lidocaine (LIDODERM) 5 % Place 1 patch onto the skin  daily. Remove & Discard patch within 12 hours or as directed by MD 01/05/23   Nicanor Alcon, [place], MD  meloxicam (MOBIC) 15 MG tablet Take 1 tablet (15 mg total) by mouth daily. 01/05/23   Palumbo, April, MD  mesalamine (LIALDA) 1.2 g EC tablet Take 2 tablets by mouth twice daily 09/12/19   Meryl Dare, MD  methocarbamol (ROBAXIN) 750 MG tablet Take 750 mg by mouth 4 (four) times daily.    [provider]  nystatin-triamcinolone (MYCOLOG II) cream Apply 1 application topically 3 (three) times daily. 05/30/19   Armando Reichert, CNM  omeprazole (PRILOSEC) 20 MG capsule Take 2 capsules (40 mg total) by mouth daily. 03/20/19   Levie Heritage, DO  ondansetron (ZOFRAN-ODT) 8 MG disintegrating tablet Take 1 tablet (8 mg total) by mouth every 8 (eight) hours as needed for nausea or vomiting. 09/28/22   Trevor Iha, FNP  oxyCODONE  (ROXICODONE) 15 MG immediate release tablet Take 15 mg by mouth 3 (three) times daily as needed. 10/23/20   [provider]  Oxycodone HCl 20 MG TABS Take 1 tablet by mouth 3 times daily as needed 06/18/22     cetirizine (ZYRTEC ALLERGY) 10 MG tablet Take 1 tablet (10 mg total) by mouth daily. 04/15/20 11/08/20  Roxy Horseman, PA-C  hydrochlorothiazide (HYDRODIURIL) 25 MG tablet Take 1 tablet (25 mg total) by mouth daily. 04/18/19 10/16/19  Katrinka Blazing, [place], CNM  metoCLOPramide (REGLAN) 10 MG tablet Take 10 mg by mouth daily as needed (For migrane.).  10/16/19  [provider]  norethindrone (MICRONOR) 0.35 MG tablet Take 1 tablet (0.35 mg total) by mouth daily. 05/14/19 10/16/19  Dorathy Kinsman, CNM    Family History Family History  Problem Relation Age of Onset   Diabetes Father    Cancer Brother        testicular   Cancer Paternal Grandfather     Social History Social History   Tobacco Use   Smoking status: Never   Smokeless tobacco: Never  Vaping Use   Vaping status: Never Used  Substance Use Topics   Alcohol use: Not Currently   Drug use: Never     Allergies   Morphine and codeine   Review of Systems Review of Systems  Constitutional:  Negative for activity change, appetite change, fatigue and fever.  HENT:  Negative for sore throat, trouble swallowing and voice change.   Gastrointestinal:  Negative for abdominal pain, diarrhea, nausea and vomiting.     Physical Exam Triage Vital Signs ED Triage Vitals  Encounter Vitals Group     BP 11/23/23 1357 (!) 142/89     Systolic BP Percentile --      Diastolic BP Percentile --      Pulse Rate 11/23/23 1357 89     Resp 11/23/23 1357 18     Temp 11/23/23 1357 (!) 97.4 F (36.3 C)     Temp Source 11/23/23 1357 Oral     SpO2 11/23/23 1357 96 %     Weight 11/23/23 1359 300 lb (136.1 kg)     Height 11/23/23 1359 5\' 6"  (1.676 m)     Head Circumference --      Peak Flow --      Pain Score 11/23/23 1356  0     Pain Loc --      Pain Education --      Exclude from Growth Chart --    No data found.  Updated Vital Signs BP (!) 142/89 (BP Location:  Left Arm)   Pulse 89   Temp (!) 97.4 F (36.3 C) (Oral) Comment: pt took sips of cold water prior to.  Resp 18   Ht 5\' 6"  (1.676 m)   Wt 300 lb (136.1 kg)   LMP  (Within Weeks)   SpO2 96%   Breastfeeding No   BMI 48.42 kg/m   Visual Acuity Right Eye Distance:   Left Eye Distance:   Bilateral Distance:    Right Eye Near:   Left Eye Near:    Bilateral Near:     Physical Exam Vitals reviewed.  Constitutional:      General: She is awake. She is not in acute distress.    Appearance: Normal appearance. She is well-developed. She is not ill-appearing.     Comments: Very pleasant female appears stated age in no acute distress sitting comfortably in exam room  HENT:     Head: Normocephalic and atraumatic.     Right Ear: External ear normal.     Left Ear: External ear normal.     Mouth/Throat:     Pharynx: Uvula midline. No oropharyngeal exudate, posterior oropharyngeal erythema or postnasal drip.     Comments: Normal-appearing posterior pharynx. Cardiovascular:     Rate and Rhythm: Normal rate and regular rhythm.     Heart sounds: Normal heart sounds, S1 normal and S2 normal. No murmur heard. Pulmonary:     Effort: Pulmonary effort is normal.     Breath sounds: Normal breath sounds. No wheezing, rhonchi or rales.     Comments: Clear to auscultation bilaterally Lymphadenopathy:     Head:     Right side of head: No submental, submandibular or tonsillar adenopathy.     Left side of head: No submental, submandibular or tonsillar adenopathy.     Cervical: No cervical adenopathy.  Psychiatric:        Behavior: Behavior is cooperative.      UC Treatments / Results  Labs (all labs ordered are listed, but only abnormal results are displayed) Labs Reviewed - No data to display  EKG   Radiology No results  found.  Procedures Procedures (including critical care time)  Medications Ordered in UC Medications - No data to display  Initial Impression / Assessment and Plan / UC Course  I have reviewed the triage vital signs and the nursing notes.  Pertinent labs & imaging results that were available during my care of the patient were reviewed by me and considered in my medical decision making (see chart for details).     Patient is well-appearing, afebrile, nontoxic, nontachycardic.  She initially requested repeat strep testing but we discussed that this is generally not indicated as long as she has been compliant with her medication to which she has been.  Given she has been on antibiotic therapy for over 24 hours we discussed that she is no longer contagious and therefore it is safe for her to return to work.  She was encouraged to dispose of her toothbrush and replace this in order to prevent reinfection.  Discussed that if she has recurrent or changing symptoms she should return for reevaluation.  Final Clinical Impressions(s) / UC Diagnoses   Final diagnoses:  Encounter to obtain excuse from work  History of strep sore throat     Discharge Instructions      Since you have been on antibiotics for more than 24 hours you can return to work as you are not contagious.  If anything changes please return for  reevaluation including increasing sore throat, swelling of her throat, difficulty swallowing, fever.  Throw your toothbrush and replace it with anyone to prevent reinfection.     ED Prescriptions   None    PDMP not reviewed this encounter.   Jeani Hawking, PA-C 11/23/23 1422

## 2024-02-01 ENCOUNTER — Ambulatory Visit: Payer: Medicaid Other | Admitting: Podiatry

## 2024-03-07 ENCOUNTER — Ambulatory Visit: Admitting: Podiatry
